# Patient Record
Sex: Female | Born: 1940
Health system: Southern US, Community
[De-identification: ages and names within clinical notes are randomized; demographics above are authoritative.]

## PROBLEM LIST (undated history)

## (undated) ENCOUNTER — Ambulatory Visit: Source: Home / Self Care

## (undated) DIAGNOSIS — I10 Essential (primary) hypertension: Secondary | ICD-10-CM

## (undated) DIAGNOSIS — Z803 Family history of malignant neoplasm of breast: Secondary | ICD-10-CM

## (undated) DIAGNOSIS — I639 Cerebral infarction, unspecified: Secondary | ICD-10-CM

## (undated) DIAGNOSIS — H269 Unspecified cataract: Secondary | ICD-10-CM

## (undated) DIAGNOSIS — Z800 Family history of malignant neoplasm of digestive organs: Secondary | ICD-10-CM

## (undated) DIAGNOSIS — Z1379 Encounter for other screening for genetic and chromosomal anomalies: Secondary | ICD-10-CM

## (undated) DIAGNOSIS — E119 Type 2 diabetes mellitus without complications: Secondary | ICD-10-CM

## (undated) DIAGNOSIS — I4891 Unspecified atrial fibrillation: Secondary | ICD-10-CM

## (undated) HISTORY — PX: CHOLECYSTECTOMY: SHX55

## (undated) HISTORY — DX: Family history of malignant neoplasm of digestive organs: Z80.0

## (undated) HISTORY — DX: Family history of malignant neoplasm of breast: Z80.3

## (undated) HISTORY — DX: Essential (primary) hypertension: I10

## (undated) HISTORY — DX: Cerebral infarction, unspecified: I63.9

## (undated) HISTORY — PX: ABDOMINAL HYSTERECTOMY: SHX81

## (undated) HISTORY — DX: Unspecified cataract: H26.9

## (undated) HISTORY — DX: Encounter for other screening for genetic and chromosomal anomalies: Z13.79

## (undated) HISTORY — DX: Unspecified atrial fibrillation: I48.91

---

## 1998-08-28 ENCOUNTER — Other Ambulatory Visit: Admission: RE | Admit: 1998-08-28 | Discharge: 1998-08-28 | Payer: Self-pay | Admitting: Gynecology

## 1998-10-24 ENCOUNTER — Encounter: Payer: Self-pay | Admitting: Gynecology

## 1998-10-29 ENCOUNTER — Inpatient Hospital Stay (HOSPITAL_COMMUNITY): Admission: RE | Admit: 1998-10-29 | Discharge: 1998-10-31 | Payer: Self-pay | Admitting: Gynecology

## 1999-07-10 ENCOUNTER — Encounter: Payer: Self-pay | Admitting: Gynecology

## 1999-07-10 ENCOUNTER — Ambulatory Visit (HOSPITAL_COMMUNITY): Admission: RE | Admit: 1999-07-10 | Discharge: 1999-07-10 | Payer: Self-pay | Admitting: Gynecology

## 2000-07-19 ENCOUNTER — Other Ambulatory Visit: Admission: RE | Admit: 2000-07-19 | Discharge: 2000-07-19 | Payer: Self-pay | Admitting: Gynecology

## 2000-08-23 ENCOUNTER — Encounter: Admission: RE | Admit: 2000-08-23 | Discharge: 2000-08-23 | Payer: Self-pay | Admitting: Gynecology

## 2000-08-23 ENCOUNTER — Encounter: Payer: Self-pay | Admitting: Gynecology

## 2001-09-07 ENCOUNTER — Other Ambulatory Visit: Admission: RE | Admit: 2001-09-07 | Discharge: 2001-09-07 | Payer: Self-pay | Admitting: Gynecology

## 2002-11-01 ENCOUNTER — Other Ambulatory Visit: Admission: RE | Admit: 2002-11-01 | Discharge: 2002-11-01 | Payer: Self-pay | Admitting: Gynecology

## 2002-11-14 ENCOUNTER — Encounter: Admission: RE | Admit: 2002-11-14 | Discharge: 2002-11-14 | Payer: Self-pay | Admitting: Gynecology

## 2002-11-14 ENCOUNTER — Encounter: Payer: Self-pay | Admitting: Gynecology

## 2006-11-14 ENCOUNTER — Other Ambulatory Visit: Admission: RE | Admit: 2006-11-14 | Discharge: 2006-11-14 | Payer: Self-pay | Admitting: Gynecology

## 2008-04-17 ENCOUNTER — Encounter: Admission: RE | Admit: 2008-04-17 | Discharge: 2008-04-17 | Payer: Self-pay | Admitting: Internal Medicine

## 2010-03-12 ENCOUNTER — Encounter: Admission: RE | Admit: 2010-03-12 | Discharge: 2010-03-12 | Payer: Self-pay | Admitting: Internal Medicine

## 2010-09-28 ENCOUNTER — Other Ambulatory Visit (HOSPITAL_COMMUNITY): Payer: Self-pay | Admitting: Ophthalmology

## 2010-09-28 ENCOUNTER — Encounter (HOSPITAL_COMMUNITY)
Admission: RE | Admit: 2010-09-28 | Discharge: 2010-09-28 | Disposition: A | Discharge status: Home / Self Care | Payer: Medicare Other | Source: Ambulatory Visit | Attending: Ophthalmology | Admitting: Ophthalmology

## 2010-09-28 ENCOUNTER — Ambulatory Visit (HOSPITAL_COMMUNITY)
Admission: RE | Admit: 2010-09-28 | Discharge: 2010-09-28 | Disposition: A | Discharge status: Home / Self Care | Payer: Medicare Other | Source: Ambulatory Visit | Attending: Ophthalmology | Admitting: Ophthalmology

## 2010-09-28 DIAGNOSIS — I10 Essential (primary) hypertension: Secondary | ICD-10-CM | POA: Insufficient documentation

## 2010-09-28 DIAGNOSIS — R059 Cough, unspecified: Secondary | ICD-10-CM | POA: Insufficient documentation

## 2010-09-28 DIAGNOSIS — F17200 Nicotine dependence, unspecified, uncomplicated: Secondary | ICD-10-CM | POA: Insufficient documentation

## 2010-09-28 DIAGNOSIS — Z01818 Encounter for other preprocedural examination: Secondary | ICD-10-CM | POA: Insufficient documentation

## 2010-09-28 DIAGNOSIS — R05 Cough: Secondary | ICD-10-CM | POA: Insufficient documentation

## 2010-09-28 DIAGNOSIS — Z01812 Encounter for preprocedural laboratory examination: Secondary | ICD-10-CM | POA: Insufficient documentation

## 2010-09-28 LAB — DIFFERENTIAL
Basophils Absolute: 0.1 10*3/uL (ref 0.0–0.1)
Lymphocytes Relative: 25 % (ref 12–46)
Lymphs Abs: 2.2 10*3/uL (ref 0.7–4.0)
Monocytes Absolute: 0.5 10*3/uL (ref 0.1–1.0)
Neutro Abs: 5.8 10*3/uL (ref 1.7–7.7)

## 2010-09-28 LAB — BASIC METABOLIC PANEL
CO2: 28 mEq/L (ref 19–32)
Calcium: 8.1 mg/dL — ABNORMAL LOW (ref 8.4–10.5)
Glucose, Bld: 120 mg/dL — ABNORMAL HIGH (ref 70–99)
Potassium: 4.5 mEq/L (ref 3.5–5.1)
Sodium: 139 mEq/L (ref 135–145)

## 2010-09-28 LAB — CBC
HCT: 41.3 % (ref 36.0–46.0)
Hemoglobin: 13.7 g/dL (ref 12.0–15.0)
MCHC: 33.2 g/dL (ref 30.0–36.0)

## 2010-09-30 ENCOUNTER — Ambulatory Visit (HOSPITAL_COMMUNITY)
Admission: RE | Admit: 2010-09-30 | Discharge: 2010-09-30 | Disposition: A | Discharge status: Home / Self Care | Payer: Medicare Other | Source: Ambulatory Visit | Attending: Ophthalmology | Admitting: Ophthalmology

## 2010-09-30 DIAGNOSIS — H269 Unspecified cataract: Secondary | ICD-10-CM | POA: Insufficient documentation

## 2010-09-30 DIAGNOSIS — E119 Type 2 diabetes mellitus without complications: Secondary | ICD-10-CM | POA: Insufficient documentation

## 2010-09-30 LAB — GLUCOSE, CAPILLARY: Glucose-Capillary: 125 mg/dL — ABNORMAL HIGH (ref 70–99)

## 2010-10-14 NOTE — Op Note (Signed)
  NAME:  Diana Cervantes, Diana Cervantes            ACCOUNT NO.:  [id number]  MEDICAL RECORD NO.:  [id number]           PATIENT TYPE:  O  LOCATION:  SDSC                         FACILITY:  [facility]  PHYSICIAN:  Shade Flood, MD       DATE OF BIRTH:  02/20/1941  DATE OF PROCEDURE:  09/30/2010 DATE OF DISCHARGE:  09/30/2010                              OPERATIVE REPORT   PREOPERATIVE DIAGNOSIS:  Cataract, left eye.  POSTOPERATIVE DIAGNOSIS:  Cataract, left eye.  PROCEDURE PERFORMED:  Phacoemulsification with posterior chamber intraocular lens, left eye.  There were no complications.  No specimens for pathology.  Estimated blood loss was less 1 mL.  The patient was prepared and draped in the usual fashion for ocular surgery on the left eye and a solid lid speculum was placed.  Calipers were used to show 3-1/2 mm at the peripheral surgical limbus centered at the 11 o'clock meridian.  A Beaver 01 blade was used to make a peripheral clear corneal groove and then the keratome was used to make a shelving self-sealing wound into the anterior chamber.  A separate stab incision was made at the 2:30 meridian of the clear cornea using a 15- degree blade.  Provisc was instilled into the anterior chamber and then a bent 26-gauge needle was used to perform a capsulorrhexis.  The patient's lens was then hydrodissected using balanced salt solution and then the Chang chopper was inserted to rotate the lens within the capsule bag to ensure adequate mobility.    The wound was enlarged to permit the placement of phaco handpiece and the Chang chopper was inserted.  A combined phaco/chop technique was employed fracturing the lens into four sections.  Each was removed with the phaco handpiece uneventfully.  The IA cannula was then used to remove cortex from the capsule bag.  Once the cortex was removed from the capsule bag, Provisc was again instilled into the anterior chamber and into the posterior capsule.  The  wound was enlarged to its full extent 3.5 mm and the Monarch injector was used to place a folded AcrySof MA50BM intraocular lens into the capsular bag.  The trailing haptic was dialed in using a Sinskey lens hook.  The IA cannula was then used to remove viscoelastic from the anterior chamber and the wound was closed with one interrupted 10-0 nylon suture.  The patient was given 100 mg of Ancef subconjunctivally with the tip of the needle visualized and then the patient was given 4 mg of Decadron subconjunctivally also with the tip of the needle visualized.  The lid speculum was removed and the patient's eye was patched using polymyxin/bacitracin ointment and a plastic shield.  She was transferred alert and conversant from the operating room to the postoperative recovery area.          ______________________________ Shade Flood, MD     GG/MEDQ  D:  09/30/2010  T:  10/01/2010  Job:  [id number]  Electronically Signed by Shade Flood MD on 10/14/2010 08:52:09 AM

## 2011-04-09 ENCOUNTER — Other Ambulatory Visit: Payer: Self-pay | Admitting: Internal Medicine

## 2011-04-09 DIAGNOSIS — Z1231 Encounter for screening mammogram for malignant neoplasm of breast: Secondary | ICD-10-CM

## 2011-04-12 ENCOUNTER — Ambulatory Visit
Admission: RE | Admit: 2011-04-12 | Discharge: 2011-04-12 | Disposition: A | Discharge status: Home / Self Care | Payer: Medicare Other | Source: Ambulatory Visit | Attending: Internal Medicine | Admitting: Internal Medicine

## 2011-04-12 DIAGNOSIS — Z1231 Encounter for screening mammogram for malignant neoplasm of breast: Secondary | ICD-10-CM

## 2011-11-22 ENCOUNTER — Other Ambulatory Visit: Payer: Self-pay | Admitting: Internal Medicine

## 2011-11-22 DIAGNOSIS — M810 Age-related osteoporosis without current pathological fracture: Secondary | ICD-10-CM

## 2011-12-17 ENCOUNTER — Ambulatory Visit
Admission: RE | Admit: 2011-12-17 | Discharge: 2011-12-17 | Disposition: A | Discharge status: Home / Self Care | Payer: Medicare Other | Source: Ambulatory Visit | Attending: Internal Medicine | Admitting: Internal Medicine

## 2011-12-17 DIAGNOSIS — M810 Age-related osteoporosis without current pathological fracture: Secondary | ICD-10-CM

## 2012-03-21 ENCOUNTER — Other Ambulatory Visit: Payer: Self-pay | Admitting: Internal Medicine

## 2012-03-21 DIAGNOSIS — Z1231 Encounter for screening mammogram for malignant neoplasm of breast: Secondary | ICD-10-CM

## 2012-04-21 ENCOUNTER — Ambulatory Visit
Admission: RE | Admit: 2012-04-21 | Discharge: 2012-04-21 | Disposition: A | Discharge status: Home / Self Care | Payer: Medicare Other | Source: Ambulatory Visit | Attending: Internal Medicine | Admitting: Internal Medicine

## 2012-04-21 DIAGNOSIS — Z1231 Encounter for screening mammogram for malignant neoplasm of breast: Secondary | ICD-10-CM

## 2012-04-25 ENCOUNTER — Other Ambulatory Visit: Payer: Self-pay | Admitting: Internal Medicine

## 2012-04-25 DIAGNOSIS — R928 Other abnormal and inconclusive findings on diagnostic imaging of breast: Secondary | ICD-10-CM

## 2012-05-09 ENCOUNTER — Ambulatory Visit
Admission: RE | Admit: 2012-05-09 | Discharge: 2012-05-09 | Disposition: A | Discharge status: Home / Self Care | Payer: Medicare Other | Source: Ambulatory Visit | Attending: Internal Medicine | Admitting: Internal Medicine

## 2012-05-09 DIAGNOSIS — R928 Other abnormal and inconclusive findings on diagnostic imaging of breast: Secondary | ICD-10-CM

## 2013-09-20 ENCOUNTER — Other Ambulatory Visit: Payer: Self-pay

## 2013-09-20 DIAGNOSIS — Z1231 Encounter for screening mammogram for malignant neoplasm of breast: Secondary | ICD-10-CM

## 2013-10-04 ENCOUNTER — Encounter (INDEPENDENT_AMBULATORY_CARE_PROVIDER_SITE_OTHER): Payer: Self-pay

## 2013-10-04 ENCOUNTER — Ambulatory Visit
Admission: RE | Admit: 2013-10-04 | Discharge: 2013-10-04 | Disposition: A | Discharge status: Home / Self Care | Payer: Medicare Other | Source: Ambulatory Visit

## 2013-10-04 DIAGNOSIS — Z1231 Encounter for screening mammogram for malignant neoplasm of breast: Secondary | ICD-10-CM

## 2014-04-12 ENCOUNTER — Other Ambulatory Visit: Payer: Self-pay | Admitting: Internal Medicine

## 2014-04-12 DIAGNOSIS — M8580 Other specified disorders of bone density and structure, unspecified site: Secondary | ICD-10-CM

## 2014-04-12 DIAGNOSIS — M810 Age-related osteoporosis without current pathological fracture: Secondary | ICD-10-CM

## 2014-06-10 ENCOUNTER — Ambulatory Visit
Admission: RE | Admit: 2014-06-10 | Discharge: 2014-06-10 | Disposition: A | Discharge status: Home / Self Care | Payer: PPO | Source: Ambulatory Visit | Attending: Internal Medicine | Admitting: Internal Medicine

## 2014-06-10 DIAGNOSIS — M8580 Other specified disorders of bone density and structure, unspecified site: Secondary | ICD-10-CM

## 2014-06-10 DIAGNOSIS — M810 Age-related osteoporosis without current pathological fracture: Secondary | ICD-10-CM

## 2015-07-19 ENCOUNTER — Ambulatory Visit (INDEPENDENT_AMBULATORY_CARE_PROVIDER_SITE_OTHER): Payer: PPO | Admitting: Family Medicine

## 2015-07-19 VITALS — BP 126/64 | HR 110 | Temp 99.2°F | Resp 14 | Ht 62.0 in | Wt 156.0 lb

## 2015-07-19 DIAGNOSIS — J209 Acute bronchitis, unspecified: Secondary | ICD-10-CM

## 2015-07-19 MED ORDER — LEVOFLOXACIN 500 MG PO TABS: 500.0000 mg | ORAL_TABLET | Freq: Every day | ORAL | Status: DC

## 2015-07-19 MED ORDER — HYDROCOD POLST-CPM POLST ER 10-8 MG/5ML PO SUER: 5.0000 mL | Freq: Two times a day (BID) | ORAL | Status: DC | PRN

## 2015-07-19 NOTE — Progress Notes (Signed)
@UMFCLOGO @  By signing my name below, I, [name], attest that this documentation has been prepared under the direction and in the presence of Robyn Haber, MD.  Electronically Signed: Thea Alken, ED Scribe. 07/19/2015. 8:52 AM.  Patient ID: Diana Cervantes MRN: [id number], DOB: February 20, 1941, 75 y.o. Date of Encounter: 07/19/2015, 8:50 AM  Primary Physician: No primary care provider on file.  Chief Complaint:  Chief Complaint  Patient presents with  . Cough    x 3 days, non productive  . Diarrhea    the first day of the cough    HPI: 75 y.o. year old female with history below presents with dry  cough that began 5 days ago. Pt states she initially had diarrhea with cough 5 days ago but this has subsided. Cough keeps her awake at night. She is unsure of fever but has a low garde fever of 99.2 in office. She reports having bronchitis in the past and that current symptoms are similar. She denies sinus pressure and congestion. She denies hx of asthma. She is a former smoker. Pt denies drug allergies.   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    Past Medical History  Diagnosis Date  . Hypertension   . Cataract      Home Meds: Prior to Admission medications   Medication Sig Start Date End Date Taking? Authorizing Provider  amLODipine (NORVASC) 5 MG tablet Take 5 mg by mouth daily.   Yes Historical Provider, MD  B Complex-C (SUPER B COMPLEX PO) Take by mouth.   Yes Historical Provider, MD  CALCIUM PO Take by mouth.   Yes Historical Provider, MD  Cholecalciferol (VITAMIN D-3) 1000 units CAPS Take by mouth.   Yes Historical Provider, MD  linagliptin (TRADJENTA) 5 MG TABS tablet Take 5 mg by mouth daily.   Yes Historical Provider, MD  Probiotic Product (PROBIOTIC DAILY PO) Take by mouth.   Yes Historical Provider, MD  Psyllium (METAMUCIL) 28.3 % POWD Take by mouth.   Yes Historical Provider, MD  rosuvastatin (CRESTOR) 10 MG tablet Take 10 mg by mouth daily.   Yes  Historical Provider, MD  valsartan (DIOVAN) 40 MG tablet Take 40 mg by mouth daily.   Yes Historical Provider, MD    Allergies:  Allergies  Allergen Reactions  . Latex     Social History   Social History  . Marital Status: Divorced    Spouse Name: N/A  . Number of Children: N/A  . Years of Education: N/A   Occupational History  . Not on file.   Social History Main Topics  . Smoking status: Former Research scientist (life sciences)  . Smokeless tobacco: Not on file     Comment: Quit 2 years ago - uses E-Ciggs  . Alcohol Use: No  . Drug Use: No  . Sexual Activity: Not on file   Other Topics Concern  . Not on file   Social History Narrative  . No narrative on file     Review of Systems: Constitutional: negative for chills, night sweats, weight changes, or fatigue  HEENT: negative for vision changes, hearing loss, congestion, rhinorrhea, ST, epistaxis, or sinus pressure Cardiovascular: negative for chest pain or palpitations Respiratory: negative for hemoptysis, wheezing, shortness of breath. Abdominal: negative for abdominal pain, nausea, vomiting, or constipation Dermatological: negative for rash Neurologic: negative for headache, dizziness, or syncope All other systems reviewed and are otherwise negative with the exception to those above and in the HPI.   Physical  Exam: Blood pressure 126/64, pulse 110, temperature 99.2 F (37.3 C), temperature source Oral, resp. rate 14, height 5\' 2"  (1.575 m), weight 156 lb (70.761 kg), SpO2 97 %., Body mass index is 28.53 kg/(m^2). General: Well developed, well nourished, in no acute distress. Head: Normocephalic, atraumatic, eyes without discharge, sclera non-icteric, nares are without discharge. Bilateral auditory canals clear, TM's are without perforation, pearly grey and translucent with reflective cone of light bilaterally. Oral cavity moist, posterior pharynx without exudate, erythema, peritonsillar abscess, or post nasal drip.  Neck: Supple. No  thyromegaly. Full ROM. No lymphadenopathy. Lungs:  Expiratory wheeze. Few faint rales in left chest.  Breathing is unlabored.  Heart: RRR with S1 S2. No murmurs, rubs, or gallops appreciated. Msk:  Strength and tone normal for age. Extremities/Skin: Warm and dry. No clubbing or cyanosis. No edema. No rashes or suspicious lesions. Neuro: Alert and oriented X 3. Moves all extremities spontaneously. Gait is normal. CNII-XII grossly in tact. Psych:  Responds to questions appropriately with a normal affect.    ASSESSMENT AND PLAN:  75 y.o. year old female with  This chart was scribed in my presence and reviewed by me personally.    ICD-9-CM ICD-10-CM   1. Acute bronchitis, unspecified organism 466.0 J20.9 levofloxacin (LEVAQUIN) 500 MG tablet     chlorpheniramine-HYDROcodone (TUSSIONEX PENNKINETIC ER) 10-8 MG/5ML SUER     Signed, Robyn Haber, MD 07/19/2015 8:50 AM

## 2015-07-19 NOTE — Patient Instructions (Signed)

## 2015-10-22 ENCOUNTER — Emergency Department (HOSPITAL_COMMUNITY): Payer: PPO

## 2015-10-22 ENCOUNTER — Ambulatory Visit (INDEPENDENT_AMBULATORY_CARE_PROVIDER_SITE_OTHER): Payer: PPO | Admitting: Physician Assistant

## 2015-10-22 ENCOUNTER — Emergency Department (HOSPITAL_COMMUNITY)
Admission: EM | Admit: 2015-10-22 | Discharge: 2015-10-22 | Disposition: A | Discharge status: Home / Self Care | Payer: PPO | Attending: Emergency Medicine | Admitting: Emergency Medicine

## 2015-10-22 ENCOUNTER — Encounter (HOSPITAL_COMMUNITY): Payer: Self-pay | Admitting: Emergency Medicine

## 2015-10-22 VITALS — BP 134/88 | HR 120 | Temp 98.2°F | Resp 17 | Ht 62.0 in | Wt 163.0 lb

## 2015-10-22 DIAGNOSIS — I10 Essential (primary) hypertension: Secondary | ICD-10-CM | POA: Diagnosis not present

## 2015-10-22 DIAGNOSIS — H269 Unspecified cataract: Secondary | ICD-10-CM | POA: Diagnosis not present

## 2015-10-22 DIAGNOSIS — R05 Cough: Secondary | ICD-10-CM | POA: Diagnosis not present

## 2015-10-22 DIAGNOSIS — I4891 Unspecified atrial fibrillation: Secondary | ICD-10-CM

## 2015-10-22 DIAGNOSIS — R0609 Other forms of dyspnea: Secondary | ICD-10-CM | POA: Diagnosis not present

## 2015-10-22 DIAGNOSIS — Z792 Long term (current) use of antibiotics: Secondary | ICD-10-CM | POA: Diagnosis not present

## 2015-10-22 DIAGNOSIS — Z91040 Latex allergy status: Secondary | ICD-10-CM | POA: Insufficient documentation

## 2015-10-22 DIAGNOSIS — J069 Acute upper respiratory infection, unspecified: Secondary | ICD-10-CM

## 2015-10-22 DIAGNOSIS — R110 Nausea: Secondary | ICD-10-CM | POA: Diagnosis not present

## 2015-10-22 DIAGNOSIS — R0602 Shortness of breath: Secondary | ICD-10-CM | POA: Diagnosis not present

## 2015-10-22 DIAGNOSIS — E119 Type 2 diabetes mellitus without complications: Secondary | ICD-10-CM | POA: Insufficient documentation

## 2015-10-22 DIAGNOSIS — Z79899 Other long term (current) drug therapy: Secondary | ICD-10-CM | POA: Diagnosis not present

## 2015-10-22 DIAGNOSIS — Z87891 Personal history of nicotine dependence: Secondary | ICD-10-CM | POA: Insufficient documentation

## 2015-10-22 DIAGNOSIS — R059 Cough, unspecified: Secondary | ICD-10-CM

## 2015-10-22 HISTORY — DX: Type 2 diabetes mellitus without complications: E11.9

## 2015-10-22 LAB — I-STAT TROPONIN, ED
Troponin i, poc: 0.01 ng/mL (ref 0.00–0.08)
Troponin i, poc: 0.01 ng/mL (ref 0.00–0.08)

## 2015-10-22 LAB — BASIC METABOLIC PANEL
Anion gap: 10 (ref 5–15)
BUN: 6 mg/dL (ref 6–20)
CALCIUM: 7.8 mg/dL — AB (ref 8.9–10.3)
CO2: 27 mmol/L (ref 22–32)
CREATININE: 0.72 mg/dL (ref 0.44–1.00)
Chloride: 102 mmol/L (ref 101–111)
GFR calc non Af Amer: >60 mL/min (ref >60)
Glucose, Bld: 144 mg/dL — ABNORMAL HIGH (ref 65–99)
Potassium: 3.3 mmol/L — ABNORMAL LOW (ref 3.5–5.1)
Sodium: 139 mmol/L (ref 135–145)

## 2015-10-22 LAB — CBC
HCT: 37.3 % (ref 36.0–46.0)
Hemoglobin: 11.8 g/dL — ABNORMAL LOW (ref 12.0–15.0)
MCH: 25.5 pg — AB (ref 26.0–34.0)
MCHC: 31.6 g/dL (ref 30.0–36.0)
MCV: 80.7 fL (ref 78.0–100.0)
PLATELETS: 234 10*3/uL (ref 150–400)
RBC: 4.62 MIL/uL (ref 3.87–5.11)
RDW: 14.4 % (ref 11.5–15.5)
WBC: 6.6 10*3/uL (ref 4.0–10.5)

## 2015-10-22 LAB — BRAIN NATRIURETIC PEPTIDE: B Natriuretic Peptide: 271.8 pg/mL — ABNORMAL HIGH (ref 0.0–100.0)

## 2015-10-22 MED ORDER — RIVAROXABAN 10 MG PO TABS
20.0000 mg | ORAL_TABLET | Freq: Once | ORAL | Status: AC
  Administered 2015-10-22: 20 mg via ORAL
  Filled 2015-10-22: qty 2

## 2015-10-22 MED ORDER — RIVAROXABAN 20 MG PO TABS: 20.0000 mg | ORAL_TABLET | Freq: Every day | ORAL | Status: DC

## 2015-10-22 MED ORDER — ALBUTEROL SULFATE (2.5 MG/3ML) 0.083% IN NEBU: 5.0000 mg | INHALATION_SOLUTION | Freq: Once | RESPIRATORY_TRACT | Status: DC

## 2015-10-22 MED ORDER — BENZONATATE 100 MG PO CAPS: 100.0000 mg | ORAL_CAPSULE | Freq: Three times a day (TID) | ORAL | Status: DC

## 2015-10-22 NOTE — ED Notes (Signed)
EDP at bedside  

## 2015-10-22 NOTE — ED Notes (Signed)
Pt arrives from [facility] via [facility] c/o SOB x 1 week with nonproductive cough.  EMS reports wheezing in L , gave 5mg  albuterol.  EMS reports depression and irreg HR on EKG. NAD noted at this time. Resp e/u.

## 2015-10-22 NOTE — ED Notes (Signed)
Pt and family wants update about what is taking so long. Informed pt that she will be DC we are just waiting on a cardiology consult (which EDP did inform them about) pt family members states "well they better make it snappy"

## 2015-10-22 NOTE — Progress Notes (Signed)
[facility] [address] [place] [address] [phone]  Date:  10/22/2015   Name:  Diana Cervantes   DOB:  10/31/1940   MRN:  [id number]  PCP:  No primary care provider on file.    History of Present Illness:  Diana Cervantes is a 75 y.o. female patient who presents to [facility] for cc of sob, nausea, and cough.   Patient reports 1 week of a non-productive cough.  She has had no fever or dyspnea.  She has no otalgia or nasal congestion.  Throat is painful secondary to coughing.  She has taken dayquil for her cough.  She denies dizziness, chest pains or sob.   Patient noticed sob that started today.  This was more prominent during the day.  She noticed it worsening as she was walking to her room, where she was having sob.  She takes baby aspirin daily.  She denies diaphoresis or leg swelling.  HTN followed by Dr. Nadyne Coombes.  She has no hx of irregular heart beat that she recalls.     There are no active problems to display for this patient.   Past Medical History  Diagnosis Date  . Hypertension   . Cataract     Past Surgical History  Procedure Laterality Date  . Cholecystectomy    . Abdominal hysterectomy      Social History  Substance Use Topics  . Smoking status: Former Research scientist (life sciences)  . Smokeless tobacco: None     Comment: Quit 2 years ago - uses E-Ciggs  . Alcohol Use: No    No family history on file.  Allergies  Allergen Reactions  . Latex     Medication list has been reviewed and updated.  Current Outpatient Prescriptions on File Prior to Visit  Medication Sig Dispense Refill  . amLODipine (NORVASC) 5 MG tablet Take 5 mg by mouth daily.    . B Complex-C (SUPER B COMPLEX PO) Take by mouth.    Marland Kitchen CALCIUM PO Take by mouth.    . chlorpheniramine-HYDROcodone (TUSSIONEX PENNKINETIC ER) 10-8 MG/5ML SUER Take 5 mLs by mouth every 12 (twelve) hours as needed. 100 mL 0  . Cholecalciferol (VITAMIN D-3) 1000 units CAPS Take by mouth.    . levofloxacin  (LEVAQUIN) 500 MG tablet Take 1 tablet (500 mg total) by mouth daily. 7 tablet 0  . linagliptin (TRADJENTA) 5 MG TABS tablet Take 5 mg by mouth daily.    . Probiotic Product (PROBIOTIC DAILY PO) Take by mouth.    . Psyllium (METAMUCIL) 28.3 % POWD Take by mouth.    . rosuvastatin (CRESTOR) 10 MG tablet Take 10 mg by mouth daily.    . valsartan (DIOVAN) 40 MG tablet Take 40 mg by mouth daily.     No current facility-administered medications on file prior to visit.    ROS ROS otherwise unremarkable unless listed above  Physical Examination: BP 134/88 mmHg  Pulse 120  Temp(Src) 98.2 F (36.8 C) (Oral)  Resp 17  Ht 5\' 2"  (1.575 m)  Wt 163 lb (73.936 kg)  BMI 29.81 kg/m2  SpO2 95% Ideal Body Weight: Weight in (lb) to have BMI = 25: 136.4  Physical Exam  Constitutional: She is oriented to person, place, and time. She appears well-developed and well-nourished. No distress.  HENT:  Head: Normocephalic and atraumatic.  Right Ear: Tympanic membrane, external ear and ear canal normal.  Left Ear: Tympanic membrane, external ear and ear canal normal.  Nose: Mucosal  edema and rhinorrhea present. Right sinus exhibits no maxillary sinus tenderness and no frontal sinus tenderness. Left sinus exhibits no maxillary sinus tenderness and no frontal sinus tenderness.  Mouth/Throat: No uvula swelling. No oropharyngeal exudate, posterior oropharyngeal edema or posterior oropharyngeal erythema.  Eyes: Conjunctivae and EOM are normal. Pupils are equal, round, and reactive to light.  Cardiovascular: An irregularly irregular rhythm present. Tachycardia present.  Exam reveals no gallop, no distant heart sounds and no friction rub.   No murmur heard. Pulses:      Radial pulses are 2+ on the left side.       Dorsalis pedis pulses are 2+ on the right side, and 2+ on the left side.  Pulmonary/Chest: Effort normal. No respiratory distress. She has no decreased breath sounds. She has no wheezes. She has no  rhonchi.  Lymphadenopathy:       Head (right side): No submandibular, no tonsillar, no preauricular and no posterior auricular adenopathy present.       Head (left side): No submandibular, no tonsillar, no preauricular and no posterior auricular adenopathy present.  Neurological: She is alert and oriented to person, place, and time.  Skin: She is not diaphoretic.  Psychiatric: She has a normal mood and affect. Her behavior is normal.   Assessment and Plan: [name] is a 75 y.o. female who is here today for cc of sob, dyspnea, or nausea. -ekg was alarming for new atrial fibrillation, and possible ischemia.  This could be secondary to dayquil, however this could possibly be heart failure.   --I am transferring [facility] to [facility] for emergent [facility].  There is possible bronchitis, but fibrillation is concerning.    Cough - Plan: EKG 12-Lead  Atrial fibrillation, unspecified type (HCC)  Dyspnea on exertion - Plan: EKG 12-Lead  Nausea without vomiting - Plan: EKG 12-Lead   Ivar Drape, PA-C [facility] 10/22/2015 6:31 PM

## 2015-10-22 NOTE — Patient Instructions (Signed)
     IF you received an x-ray today, you will receive an invoice from [facility]. Please contact [facility] at [phone] with questions or concerns regarding your invoice.   IF you received labwork today, you will receive an invoice from [facility]. Please contact [facility] at [phone] with questions or concerns regarding your invoice.   Our billing staff will not be able to assist you with questions regarding bills from these companies.  You will be contacted with the lab results as soon as they are available. The fastest way to get your results is to activate your My Chart account. Instructions are located on the last page of this paperwork. If you have not heard from us regarding the results in 2 weeks, please contact this office.      

## 2015-10-22 NOTE — Discharge Instructions (Signed)
Atrial Fibrillation Atrial fibrillation is a type of heartbeat that is irregular or fast (rapid). If you have this condition, your heart keeps quivering in a weird (chaotic) way. This condition can make it so your heart cannot pump blood normally. Having this condition gives a person more risk for stroke, heart failure, and other heart problems. There are different types of atrial fibrillation. Talk with your doctor to learn about the type that you have. HOME CARE  Take over-the-counter and prescription medicines only as told by your doctor.  If your doctor prescribed a blood-thinning medicine, take it exactly as told. Taking too much of it can cause bleeding. If you do not take enough of it, you will not have the protection that you need against stroke and other problems.  Do not use any tobacco products. These include cigarettes, chewing tobacco, and e-cigarettes. If you need help quitting, ask your doctor.  If you have apnea (obstructive sleep apnea), manage it as told by your doctor.  Do not drink alcohol.  Do not drink beverages that have caffeine. These include coffee, soda, and tea.  Maintain a healthy weight. Do not use diet pills unless your doctor says they are safe for you. Diet pills may make heart problems worse.  Follow diet instructions as told by your doctor.  Exercise regularly as told by your doctor.  Keep all follow-up visits as told by your doctor. This is important. GET HELP IF:  You notice a change in the speed, rhythm, or strength of your heartbeat.  You are taking a blood-thinning medicine and you notice more bruising.  You get tired more easily when you move or exercise. GET HELP RIGHT AWAY IF:  You have pain in your chest or your belly (abdomen).  You have sweating or weakness.  You feel sick to your stomach (nauseous).  You notice blood in your throw up (vomit), poop (stool), or pee (urine).  You are short of breath.  You suddenly have swollen feet  and ankles.  You feel dizzy.  Your suddenly get weak or numb in your face, arms, or legs, especially if it happens on one side of your body.  You have trouble talking, trouble understanding, or both.  Your face or your eyelid droops on one side. These symptoms may be an emergency. Do not wait to see if the symptoms will go away. Get medical help right away. Call your local emergency services (911 in the [place] Do not drive yourself to the hospital.   This information is not intended to replace advice given to you by your health care provider. Make sure you discuss any questions you have with your health care provider.   Document Released: 02/24/2008 Document Revised: 02/05/2015 Document Reviewed: 09/11/2014 Elsevier Interactive Patient Education 2016 [place] on my medicine - XARELTO (Rivaroxaban)  This medication education was reviewed with me or my healthcare representative as part of my discharge preparation.  The pharmacist that spoke with me during my hospital stay was:  Rebecka Apley, [facility]  Why was Xarelto prescribed for you? Xarelto was prescribed for you to reduce the risk of a blood clot forming that can cause a stroke if you have a medical condition called atrial fibrillation (a type of irregular heartbeat).  What do you need to know about xarelto ? Take your Xarelto ONCE DAILY at the same time every day with your evening meal. If you have difficulty swallowing the tablet whole, you may crush it and mix in  applesauce just prior to taking your dose.  Take Xarelto exactly as prescribed by your doctor and DO NOT stop taking Xarelto without talking to the doctor who prescribed the medication.  Stopping without other stroke prevention medication to take the place of Xarelto may increase your risk of developing a clot that causes a stroke.  Refill your prescription before you run out.  After discharge, you should have regular check-up appointments with your  healthcare provider that is prescribing your Xarelto.  In the future your dose may need to be changed if your kidney function or weight changes by a significant amount.  What do you do if you miss a dose? If you are taking Xarelto ONCE DAILY and you miss a dose, take it as soon as you remember on the same day then continue your regularly scheduled once daily regimen the next day. Do not take two doses of Xarelto at the same time or on the same day.   Important Safety Information A possible side effect of Xarelto is bleeding. You should call your healthcare provider right away if you experience any of the following: ? Bleeding from an injury or your nose that does not stop. ? Unusual colored urine (red or dark brown) or unusual colored stools (red or black). ? Unusual bruising for unknown reasons. ? A serious fall or if you hit your head (even if there is no bleeding).  Some medicines may interact with Xarelto and might increase your risk of bleeding while on Xarelto. To help avoid this, consult your healthcare provider or pharmacist prior to using any new prescription or non-prescription medications, including herbals, vitamins, non-steroidal anti-inflammatory drugs (NSAIDs) and supplements.  This website has more information on Xarelto: [email or web address]-benson.[email or web address]

## 2015-10-22 NOTE — ED Provider Notes (Signed)
Pt presents to the ED with new onset a fib.  Sent from urgent care after going there for a non productive cough.   Physical Exam  BP 139/73 mmHg  Pulse 88  Temp(Src) 97.7 F (36.5 C) (Oral)  Resp 23  Ht 5\' 2"  (1.575 m)  Wt 72.576 kg  BMI 29.26 kg/m2  SpO2 94%  Physical Exam  Constitutional: She appears well-developed and well-nourished. No distress.  HENT:  Head: Normocephalic and atraumatic.  Right Ear: External ear normal.  Left Ear: External ear normal.  Eyes: Conjunctivae are normal. Right eye exhibits no discharge. Left eye exhibits no discharge. No scleral icterus.  Neck: Neck supple. No tracheal deviation present.  Cardiovascular: Normal rate.  An irregularly irregular rhythm present.  Pulmonary/Chest: Effort normal. No stridor. No respiratory distress.  Musculoskeletal: She exhibits no edema.  Neurological: She is alert. Cranial nerve deficit: no gross deficits.  Skin: Skin is warm and dry. No rash noted.  Psychiatric: She has a normal mood and affect.  Nursing note and vitals reviewed.   ED Course  Procedures  EKG Interpretation  Date/Time:  Wednesday Oct 22 2015 18:27:59 EDT Ventricular Rate:  104 PR Interval:    QRS Duration: 82 QT Interval:  349 QTC Calculation: 459 R Axis:   75 Text Interpretation:  probale sinus tahcycaridia vs atrial flutter, poor quality ECG Probable LVH with secondary repol abnrm ST depression, consider ischemia, diffuse lds similar to prior ECG Reconfirmed by Juanjesus Pepperman  MD-J, Aniyha Tate ([id number]) on 10/22/2015 7:07:04 PM       MDM  Chads Vasc2 = 4.   Pt is rate controlled new onset a fib.  Bedside monitor suggestive of a fib. Will start on oral anticoagulant.  Consider cardizem cd 120.  Will discuss with cardiology but anticipate dc and follow up a fib clinic.    Dorie Rank, MD 10/22/15 [phone]

## 2015-10-22 NOTE — ED Provider Notes (Signed)
CSN: [id number]     Arrival date & time 10/22/15  1824 History   First MD Initiated Contact with Patient 10/22/15 1825     Chief Complaint  Patient presents with  . Shortness of Breath   The history is provided by the patient and medical records. No language interpreter was used.  Patient is a 75 year old female with past medical history of hypertension and diabetes who presents with 3 days of URI symptoms with nonproductive cough, SOB, congestion and rhinorrhea. Patient reports she has been taking NyQuil with minimal relief. She went to urgent care today for evaluation. She was found to be tachycardic and EKG performed there that showed concern for atrial fibrillation. Patient denies previous history of irregular heartbeat. She takes baby aspirin daily but is not otherwise anticoagulated. Denies chest pain, palpitations or lower extremity edema.  Past Medical History  Diagnosis Date  . Hypertension   . Cataract   . Diabetes mellitus without complication [facility])    Past Surgical History  Procedure Laterality Date  . Cholecystectomy    . Abdominal hysterectomy     No family history on file. Social History  Substance Use Topics  . Smoking status: Former Smoker    Quit date: 10/21/2013  . Smokeless tobacco: Current User     Comment: Quit 2 years ago - uses Secondary school teacher  . Alcohol Use: No   OB History    No data available     Review of Systems  Constitutional: Negative for fever and chills.  HENT: Positive for congestion and rhinorrhea.   Eyes: Negative for visual disturbance.  Respiratory: Positive for cough, shortness of breath and wheezing.   Cardiovascular: Negative for chest pain, palpitations and leg swelling.  Gastrointestinal: Negative for nausea, vomiting, abdominal pain, diarrhea and constipation.  Genitourinary: Negative for dysuria and difficulty urinating.  Musculoskeletal: Negative for back pain and neck pain.  Skin: Negative for pallor and rash.  Neurological: Negative  for dizziness and headaches.  Psychiatric/Behavioral: Negative for confusion.      Allergies  Latex  Home Medications   Prior to Admission medications   Medication Sig Start Date End Date Taking? Authorizing Provider  amLODipine (NORVASC) 5 MG tablet Take 5 mg by mouth daily.    Historical Provider, MD  B Complex-C (SUPER B COMPLEX PO) Take by mouth.    Historical Provider, MD  CALCIUM PO Take by mouth.    Historical Provider, MD  chlorpheniramine-HYDROcodone (TUSSIONEX PENNKINETIC ER) 10-8 MG/5ML SUER Take 5 mLs by mouth every 12 (twelve) hours as needed. 07/19/15   Robyn Haber, MD  Cholecalciferol (VITAMIN D-3) 1000 units CAPS Take by mouth.    Historical Provider, MD  levofloxacin (LEVAQUIN) 500 MG tablet Take 1 tablet (500 mg total) by mouth daily. 07/19/15   Robyn Haber, MD  linagliptin (TRADJENTA) 5 MG TABS tablet Take 5 mg by mouth daily.    Historical Provider, MD  Probiotic Product (PROBIOTIC DAILY PO) Take by mouth.    Historical Provider, MD  Psyllium (METAMUCIL) 28.3 % POWD Take by mouth.    Historical Provider, MD  rosuvastatin (CRESTOR) 10 MG tablet Take 10 mg by mouth daily.    Historical Provider, MD  valsartan (DIOVAN) 40 MG tablet Take 40 mg by mouth daily.    Historical Provider, MD   BP 169/88 mmHg  Pulse 107  Temp(Src) 97.7 F (36.5 C) (Oral)  Resp 21  Ht 5\' 2"  (1.575 m)  Wt 72.576 kg  BMI 29.26 kg/m2  SpO2 94% Physical Exam  Constitutional: She is oriented to person, place, and time. She appears well-developed and well-nourished. No distress.  HENT:  Head: Normocephalic and atraumatic.  Eyes: EOM are normal. Pupils are equal, round, and reactive to light.  Neck: Normal range of motion. Neck supple.  Cardiovascular: Intact distal pulses.  An irregularly irregular rhythm present. Tachycardia present.   Pulmonary/Chest: Effort normal. No respiratory distress. She has wheezes (scattered expiratory wheezes).  Abdominal: Soft. She exhibits no  distension. There is no tenderness.  Musculoskeletal: Normal range of motion. She exhibits no edema or tenderness.  Neurological: She is alert and oriented to person, place, and time.  Skin: Skin is warm and dry. No rash noted.  Psychiatric: She has a normal mood and affect.  Nursing note and vitals reviewed.   ED Course  Procedures (including critical care time) Labs Review Labs Reviewed  BASIC METABOLIC PANEL - Abnormal; Notable for the following:    Potassium 3.3 (*)    Glucose, Bld 144 (*)    Calcium 7.8 (*)    All other components within normal limits  CBC - Abnormal; Notable for the following:    Hemoglobin 11.8 (*)    MCH 25.5 (*)    All other components within normal limits  BRAIN NATRIURETIC PEPTIDE - Abnormal; Notable for the following:    B Natriuretic Peptide 271.8 (*)    All other components within normal limits  I-STAT TROPOININ, ED  I-STAT TROPOININ, ED  Randolm Idol, ED    Imaging Review Dg Chest 2 View  10/22/2015  CLINICAL DATA:  Shortness of breath for 1 week. EXAM: CHEST  2 VIEW COMPARISON:  September 28, 2010 FINDINGS: The heart size and mediastinal contours are within normal limits. There is mild diffuse increased pulmonary interstitium bilaterally. There is no focal pneumonia or pleural effusion. The visualized skeletal structures are unremarkable. IMPRESSION: Mild increased pulmonary interstitium bilaterally. This is nonspecific. This can be seen in interstitial edema or viral infection. Electronically Signed   By: Abelardo Diesel M.D.   On: 10/22/2015 19:59   I have personally reviewed and evaluated these images and lab results as part of my medical decision-making.   EKG Interpretation   Date/Time:  Wednesday Oct 22 2015 18:27:59 EDT Ventricular Rate:  104 PR Interval:    QRS Duration: 82 QT Interval:  349 QTC Calculation: 459 R Axis:   75 Text Interpretation:  probale sinus tahcycaridia vs atrial flutter, poor  quality ECG Probable LVH with  secondary repol abnrm ST depression,  consider ischemia, diffuse lds similar to prior ECG Reconfirmed by KNAPP   MD-J, JON ([id number]) on 10/22/2015 7:07:04 PM      MDM   Final diagnoses:  URI (upper respiratory infection)  Atrial fibrillation, new onset [facility])   Patient is a 75 year old female who presents from urgent care clinic for evaluation of possible new onset atrial fibrillation. On presentation patient is mildly tachycardic with heart rate in the 100s to 110s. Blood pressures are stable. EKG shows sinus tachycardia versus atrial flutter. Patient appears to be in atrial fibrillation on the cardiac monitor. On exam she is a well-appearing with scattered expiratory wheezes but no other significant findings. Hx of chest x-rays consistent with viral URI. Labs including CBC, troponin and BNP are unremarkable. Patient has mild hypokalemia potassium 3.3. Reassessment after labs and imaging patient continues to be in rate controlled atrial fibrillation on the monitor. She reports feeling much better and is tolerating by mouth. Timing of onset of her symptoms are unclear. Not  a candidate for cardioversion.  This patients CHA2DS2-VASc Score and unadjusted Ischemic Stroke Rate (% per year) is equal to 4.8 % stroke rate/year from a score of 4  Above score calculated as 1 point each if present [CHF, HTN, DM, Vascular=MI/PAD/Aortic Plaque, Age if 65-74, or Female] Above score calculated as 2 points each if present [Age > 75, or Stroke/TIA/TE]  Patient was started on several different anticoagulation as she is moderate risk for stroke secondary to atrial fibrillation.  Discussed this patient with Dr. Einar Gip, her cardiologist by telephone. He agrees with an regulation with a relative. Advised against starting rate control at this time as patient heart rate is in the 80s.    Findings were discussed with patient. She agrees with starting Xarelto. Risks and benefits of anticoagulation discussed and patient is  in agreement with this plan. Advised her to call her cardiologist tomorrow to set up close follow-up for reevaluation. Otherwise she may follow-up in the atrial fibrillation clinic. Discharged in stable condition. Prescription for Xarelto was given. Prescription for [facility] given for cough. Strict return precautions were discussed and patient in agreement with plan.  Patient seen and discussed with Dr. Tomi Bamberger, ED attending  Gibson Ramp, MD 10/23/15 [id number]  Dorie Rank, MD 10/23/15 1030

## 2015-10-29 ENCOUNTER — Encounter (HOSPITAL_COMMUNITY): Payer: Self-pay | Admitting: *Deleted

## 2015-12-12 DIAGNOSIS — M25572 Pain in left ankle and joints of left foot: Secondary | ICD-10-CM | POA: Diagnosis not present

## 2015-12-12 DIAGNOSIS — E784 Other hyperlipidemia: Secondary | ICD-10-CM | POA: Diagnosis not present

## 2015-12-12 DIAGNOSIS — M25579 Pain in unspecified ankle and joints of unspecified foot: Secondary | ICD-10-CM | POA: Diagnosis not present

## 2015-12-12 DIAGNOSIS — I10 Essential (primary) hypertension: Secondary | ICD-10-CM | POA: Diagnosis not present

## 2015-12-12 DIAGNOSIS — Z1389 Encounter for screening for other disorder: Secondary | ICD-10-CM | POA: Diagnosis not present

## 2015-12-12 DIAGNOSIS — M25571 Pain in right ankle and joints of right foot: Secondary | ICD-10-CM | POA: Diagnosis not present

## 2015-12-12 DIAGNOSIS — E119 Type 2 diabetes mellitus without complications: Secondary | ICD-10-CM | POA: Diagnosis not present

## 2016-01-16 DIAGNOSIS — I10 Essential (primary) hypertension: Secondary | ICD-10-CM | POA: Diagnosis not present

## 2016-01-16 DIAGNOSIS — F17290 Nicotine dependence, other tobacco product, uncomplicated: Secondary | ICD-10-CM | POA: Diagnosis not present

## 2016-01-16 DIAGNOSIS — I119 Hypertensive heart disease without heart failure: Secondary | ICD-10-CM | POA: Diagnosis not present

## 2016-01-16 DIAGNOSIS — E119 Type 2 diabetes mellitus without complications: Secondary | ICD-10-CM | POA: Diagnosis not present

## 2016-01-16 DIAGNOSIS — R600 Localized edema: Secondary | ICD-10-CM | POA: Diagnosis not present

## 2016-01-20 DIAGNOSIS — I119 Hypertensive heart disease without heart failure: Secondary | ICD-10-CM | POA: Diagnosis not present

## 2016-01-25 DIAGNOSIS — E119 Type 2 diabetes mellitus without complications: Secondary | ICD-10-CM | POA: Diagnosis not present

## 2016-01-25 DIAGNOSIS — M25579 Pain in unspecified ankle and joints of unspecified foot: Secondary | ICD-10-CM | POA: Diagnosis not present

## 2016-01-25 DIAGNOSIS — I10 Essential (primary) hypertension: Secondary | ICD-10-CM | POA: Diagnosis not present

## 2016-01-25 DIAGNOSIS — Z1389 Encounter for screening for other disorder: Secondary | ICD-10-CM | POA: Diagnosis not present

## 2016-01-25 DIAGNOSIS — E784 Other hyperlipidemia: Secondary | ICD-10-CM | POA: Diagnosis not present

## 2016-01-27 DIAGNOSIS — E785 Hyperlipidemia, unspecified: Secondary | ICD-10-CM | POA: Diagnosis not present

## 2016-01-27 DIAGNOSIS — I4891 Unspecified atrial fibrillation: Secondary | ICD-10-CM | POA: Diagnosis not present

## 2016-01-27 DIAGNOSIS — I119 Hypertensive heart disease without heart failure: Secondary | ICD-10-CM | POA: Diagnosis not present

## 2016-01-27 DIAGNOSIS — I4892 Unspecified atrial flutter: Secondary | ICD-10-CM | POA: Diagnosis not present

## 2016-02-04 ENCOUNTER — Other Ambulatory Visit: Payer: Self-pay | Admitting: Internal Medicine

## 2016-02-04 DIAGNOSIS — Z1231 Encounter for screening mammogram for malignant neoplasm of breast: Secondary | ICD-10-CM

## 2016-02-11 ENCOUNTER — Ambulatory Visit
Admission: RE | Admit: 2016-02-11 | Discharge: 2016-02-11 | Disposition: A | Discharge status: Home / Self Care | Payer: PPO | Source: Ambulatory Visit | Attending: Internal Medicine | Admitting: Internal Medicine

## 2016-02-11 DIAGNOSIS — Z1231 Encounter for screening mammogram for malignant neoplasm of breast: Secondary | ICD-10-CM | POA: Diagnosis not present

## 2016-02-24 DIAGNOSIS — I119 Hypertensive heart disease without heart failure: Secondary | ICD-10-CM | POA: Diagnosis not present

## 2016-02-24 DIAGNOSIS — E785 Hyperlipidemia, unspecified: Secondary | ICD-10-CM | POA: Diagnosis not present

## 2016-02-24 DIAGNOSIS — I480 Paroxysmal atrial fibrillation: Secondary | ICD-10-CM | POA: Diagnosis not present

## 2016-03-17 DIAGNOSIS — R600 Localized edema: Secondary | ICD-10-CM | POA: Diagnosis not present

## 2016-03-17 DIAGNOSIS — M1711 Unilateral primary osteoarthritis, right knee: Secondary | ICD-10-CM | POA: Diagnosis not present

## 2016-03-17 DIAGNOSIS — I10 Essential (primary) hypertension: Secondary | ICD-10-CM | POA: Diagnosis not present

## 2016-03-17 DIAGNOSIS — E1165 Type 2 diabetes mellitus with hyperglycemia: Secondary | ICD-10-CM | POA: Diagnosis not present

## 2016-03-17 DIAGNOSIS — M25579 Pain in unspecified ankle and joints of unspecified foot: Secondary | ICD-10-CM | POA: Diagnosis not present

## 2016-03-17 DIAGNOSIS — M85861 Other specified disorders of bone density and structure, right lower leg: Secondary | ICD-10-CM | POA: Diagnosis not present

## 2016-03-17 DIAGNOSIS — M7731 Calcaneal spur, right foot: Secondary | ICD-10-CM | POA: Diagnosis not present

## 2016-03-17 DIAGNOSIS — E784 Other hyperlipidemia: Secondary | ICD-10-CM | POA: Diagnosis not present

## 2016-03-17 DIAGNOSIS — M85871 Other specified disorders of bone density and structure, right ankle and foot: Secondary | ICD-10-CM | POA: Diagnosis not present

## 2016-03-17 DIAGNOSIS — Z23 Encounter for immunization: Secondary | ICD-10-CM | POA: Diagnosis not present

## 2016-03-17 DIAGNOSIS — M2011 Hallux valgus (acquired), right foot: Secondary | ICD-10-CM | POA: Diagnosis not present

## 2016-03-17 DIAGNOSIS — M19071 Primary osteoarthritis, right ankle and foot: Secondary | ICD-10-CM | POA: Diagnosis not present

## 2016-03-17 DIAGNOSIS — Z6829 Body mass index (BMI) 29.0-29.9, adult: Secondary | ICD-10-CM | POA: Diagnosis not present

## 2016-03-23 DIAGNOSIS — Z780 Asymptomatic menopausal state: Secondary | ICD-10-CM | POA: Diagnosis not present

## 2016-05-04 ENCOUNTER — Inpatient Hospital Stay (HOSPITAL_COMMUNITY)
Admission: EM | Admit: 2016-05-04 | Discharge: 2016-05-08 | DRG: 023 | Disposition: A | Discharge status: Home Health | Payer: PPO | Attending: Neurology | Admitting: Neurology

## 2016-05-04 ENCOUNTER — Emergency Department (HOSPITAL_COMMUNITY): Payer: PPO | Admitting: Certified Registered Nurse Anesthetist

## 2016-05-04 ENCOUNTER — Inpatient Hospital Stay (HOSPITAL_COMMUNITY): Payer: PPO

## 2016-05-04 ENCOUNTER — Emergency Department (HOSPITAL_COMMUNITY): Payer: PPO

## 2016-05-04 ENCOUNTER — Encounter (HOSPITAL_COMMUNITY): Payer: Self-pay | Admitting: Radiology

## 2016-05-04 ENCOUNTER — Encounter (HOSPITAL_COMMUNITY)
Admission: EM | Disposition: A | Discharge status: Home Health | Payer: Self-pay | Source: Home / Self Care | Attending: Neurology

## 2016-05-04 DIAGNOSIS — H5347 Heteronymous bilateral field defects: Secondary | ICD-10-CM | POA: Diagnosis present

## 2016-05-04 DIAGNOSIS — I63512 Cerebral infarction due to unspecified occlusion or stenosis of left middle cerebral artery: Secondary | ICD-10-CM | POA: Diagnosis not present

## 2016-05-04 DIAGNOSIS — Z87891 Personal history of nicotine dependence: Secondary | ICD-10-CM | POA: Diagnosis not present

## 2016-05-04 DIAGNOSIS — J9600 Acute respiratory failure, unspecified whether with hypoxia or hypercapnia: Secondary | ICD-10-CM | POA: Diagnosis not present

## 2016-05-04 DIAGNOSIS — I63412 Cerebral infarction due to embolism of left middle cerebral artery: Principal | ICD-10-CM | POA: Diagnosis present

## 2016-05-04 DIAGNOSIS — E785 Hyperlipidemia, unspecified: Secondary | ICD-10-CM | POA: Diagnosis not present

## 2016-05-04 DIAGNOSIS — I6300 Cerebral infarction due to thrombosis of unspecified precerebral artery: Secondary | ICD-10-CM

## 2016-05-04 DIAGNOSIS — G8191 Hemiplegia, unspecified affecting right dominant side: Secondary | ICD-10-CM | POA: Diagnosis present

## 2016-05-04 DIAGNOSIS — Y92002 Bathroom of unspecified non-institutional (private) residence single-family (private) house as the place of occurrence of the external cause: Secondary | ICD-10-CM | POA: Diagnosis not present

## 2016-05-04 DIAGNOSIS — E876 Hypokalemia: Secondary | ICD-10-CM | POA: Diagnosis not present

## 2016-05-04 DIAGNOSIS — I10 Essential (primary) hypertension: Secondary | ICD-10-CM | POA: Diagnosis present

## 2016-05-04 DIAGNOSIS — I4891 Unspecified atrial fibrillation: Secondary | ICD-10-CM | POA: Diagnosis present

## 2016-05-04 DIAGNOSIS — W19XXXA Unspecified fall, initial encounter: Secondary | ICD-10-CM | POA: Diagnosis not present

## 2016-05-04 DIAGNOSIS — J9690 Respiratory failure, unspecified, unspecified whether with hypoxia or hypercapnia: Secondary | ICD-10-CM | POA: Diagnosis not present

## 2016-05-04 DIAGNOSIS — I638 Other cerebral infarction: Secondary | ICD-10-CM | POA: Diagnosis not present

## 2016-05-04 DIAGNOSIS — E1159 Type 2 diabetes mellitus with other circulatory complications: Secondary | ICD-10-CM | POA: Diagnosis not present

## 2016-05-04 DIAGNOSIS — Z7901 Long term (current) use of anticoagulants: Secondary | ICD-10-CM | POA: Diagnosis not present

## 2016-05-04 DIAGNOSIS — H269 Unspecified cataract: Secondary | ICD-10-CM | POA: Diagnosis present

## 2016-05-04 DIAGNOSIS — I6789 Other cerebrovascular disease: Secondary | ICD-10-CM | POA: Diagnosis not present

## 2016-05-04 DIAGNOSIS — R4701 Aphasia: Secondary | ICD-10-CM | POA: Diagnosis present

## 2016-05-04 DIAGNOSIS — I639 Cerebral infarction, unspecified: Secondary | ICD-10-CM | POA: Diagnosis not present

## 2016-05-04 DIAGNOSIS — F17210 Nicotine dependence, cigarettes, uncomplicated: Secondary | ICD-10-CM | POA: Diagnosis not present

## 2016-05-04 DIAGNOSIS — R0989 Other specified symptoms and signs involving the circulatory and respiratory systems: Secondary | ICD-10-CM | POA: Diagnosis not present

## 2016-05-04 DIAGNOSIS — I629 Nontraumatic intracranial hemorrhage, unspecified: Secondary | ICD-10-CM | POA: Diagnosis not present

## 2016-05-04 DIAGNOSIS — G839 Paralytic syndrome, unspecified: Secondary | ICD-10-CM | POA: Diagnosis not present

## 2016-05-04 DIAGNOSIS — R531 Weakness: Secondary | ICD-10-CM

## 2016-05-04 DIAGNOSIS — Z91040 Latex allergy status: Secondary | ICD-10-CM

## 2016-05-04 DIAGNOSIS — I482 Chronic atrial fibrillation: Secondary | ICD-10-CM | POA: Diagnosis not present

## 2016-05-04 DIAGNOSIS — E669 Obesity, unspecified: Secondary | ICD-10-CM | POA: Diagnosis not present

## 2016-05-04 DIAGNOSIS — S199XXA Unspecified injury of neck, initial encounter: Secondary | ICD-10-CM | POA: Diagnosis not present

## 2016-05-04 DIAGNOSIS — Z888 Allergy status to other drugs, medicaments and biological substances status: Secondary | ICD-10-CM

## 2016-05-04 DIAGNOSIS — E119 Type 2 diabetes mellitus without complications: Secondary | ICD-10-CM | POA: Diagnosis present

## 2016-05-04 DIAGNOSIS — Z4682 Encounter for fitting and adjustment of non-vascular catheter: Secondary | ICD-10-CM | POA: Diagnosis not present

## 2016-05-04 DIAGNOSIS — Q251 Coarctation of aorta: Secondary | ICD-10-CM

## 2016-05-04 DIAGNOSIS — I480 Paroxysmal atrial fibrillation: Secondary | ICD-10-CM | POA: Diagnosis not present

## 2016-05-04 DIAGNOSIS — R4781 Slurred speech: Secondary | ICD-10-CM | POA: Diagnosis not present

## 2016-05-04 DIAGNOSIS — I63312 Cerebral infarction due to thrombosis of left middle cerebral artery: Secondary | ICD-10-CM | POA: Diagnosis present

## 2016-05-04 DIAGNOSIS — M6281 Muscle weakness (generalized): Secondary | ICD-10-CM | POA: Diagnosis not present

## 2016-05-04 DIAGNOSIS — R29810 Facial weakness: Secondary | ICD-10-CM | POA: Diagnosis not present

## 2016-05-04 DIAGNOSIS — I6602 Occlusion and stenosis of left middle cerebral artery: Secondary | ICD-10-CM | POA: Diagnosis not present

## 2016-05-04 HISTORY — PX: IR GENERIC HISTORICAL: IMG1180011

## 2016-05-04 HISTORY — PX: RADIOLOGY WITH ANESTHESIA: SHX6223

## 2016-05-04 LAB — BLOOD GAS, ARTERIAL
ACID-BASE DEFICIT: 0.3 mmol/L (ref 0.0–2.0)
Bicarbonate: 22.9 mmol/L (ref 20.0–28.0)
Drawn by: 406621
FIO2: 60
O2 Saturation: 99.5 %
PATIENT TEMPERATURE: 96.8
PCO2 ART: 30.2 mmHg — AB (ref 32.0–48.0)
PEEP/CPAP: 5 cmH2O
PH ART: 7.487 — AB (ref 7.350–7.450)
RATE: 14 resp/min
VT: 580 mL
pO2, Arterial: 188 mmHg — ABNORMAL HIGH (ref 83.0–108.0)

## 2016-05-04 LAB — CBC
HEMATOCRIT: 34 % — AB (ref 36.0–46.0)
HEMOGLOBIN: 11.2 g/dL — AB (ref 12.0–15.0)
MCH: 26.7 pg (ref 26.0–34.0)
MCHC: 32.9 g/dL (ref 30.0–36.0)
MCV: 81.1 fL (ref 78.0–100.0)
Platelets: 273 10*3/uL (ref 150–400)
RBC: 4.19 MIL/uL (ref 3.87–5.11)
RDW: 13.8 % (ref 11.5–15.5)
WBC: 10.1 10*3/uL (ref 4.0–10.5)

## 2016-05-04 LAB — DIFFERENTIAL
Basophils Absolute: 0 10*3/uL (ref 0.0–0.1)
Basophils Relative: 0 %
Eosinophils Absolute: 0.1 10*3/uL (ref 0.0–0.7)
Eosinophils Relative: 1 %
LYMPHS PCT: 8 %
Lymphs Abs: 0.8 10*3/uL (ref 0.7–4.0)
MONO ABS: 0.5 10*3/uL (ref 0.1–1.0)
MONOS PCT: 5 %
NEUTROS ABS: 8.7 10*3/uL — AB (ref 1.7–7.7)
Neutrophils Relative %: 86 %

## 2016-05-04 LAB — CBG MONITORING, ED: Glucose-Capillary: 175 mg/dL — ABNORMAL HIGH (ref 65–99)

## 2016-05-04 LAB — COMPREHENSIVE METABOLIC PANEL
ALK PHOS: 63 U/L (ref 38–126)
ALT: 19 U/L (ref 14–54)
AST: 27 U/L (ref 15–41)
Albumin: 3.6 g/dL (ref 3.5–5.0)
Anion gap: 11 (ref 5–15)
BUN: 7 mg/dL (ref 6–20)
CALCIUM: 7.8 mg/dL — AB (ref 8.9–10.3)
CO2: 25 mmol/L (ref 22–32)
CREATININE: 0.67 mg/dL (ref 0.44–1.00)
Chloride: 103 mmol/L (ref 101–111)
Glucose, Bld: 182 mg/dL — ABNORMAL HIGH (ref 65–99)
Potassium: 3.1 mmol/L — ABNORMAL LOW (ref 3.5–5.1)
Sodium: 139 mmol/L (ref 135–145)
Total Bilirubin: 0.6 mg/dL (ref 0.3–1.2)
Total Protein: 7 g/dL (ref 6.5–8.1)

## 2016-05-04 LAB — URINALYSIS, ROUTINE W REFLEX MICROSCOPIC
Bacteria, UA: NONE SEEN
Bilirubin Urine: NEGATIVE
GLUCOSE, UA: NEGATIVE mg/dL
Ketones, ur: NEGATIVE mg/dL
NITRITE: NEGATIVE
PH: 5 (ref 5.0–8.0)
PROTEIN: NEGATIVE mg/dL
SPECIFIC GRAVITY, URINE: 1.016 (ref 1.005–1.030)
Squamous Epithelial / LPF: NONE SEEN

## 2016-05-04 LAB — CBC WITH DIFFERENTIAL/PLATELET
BASOS PCT: 0 %
Basophils Absolute: 0 10*3/uL (ref 0.0–0.1)
EOS ABS: 0 10*3/uL (ref 0.0–0.7)
Eosinophils Relative: 1 %
HCT: 30.4 % — ABNORMAL LOW (ref 36.0–46.0)
HEMOGLOBIN: 10 g/dL — AB (ref 12.0–15.0)
Lymphocytes Relative: 13 %
Lymphs Abs: 0.9 10*3/uL (ref 0.7–4.0)
MCH: 26.5 pg (ref 26.0–34.0)
MCHC: 32.9 g/dL (ref 30.0–36.0)
MCV: 80.6 fL (ref 78.0–100.0)
MONO ABS: 0.4 10*3/uL (ref 0.1–1.0)
MONOS PCT: 6 %
NEUTROS PCT: 80 %
Neutro Abs: 5.5 10*3/uL (ref 1.7–7.7)
Platelets: 241 10*3/uL (ref 150–400)
RBC: 3.77 MIL/uL — ABNORMAL LOW (ref 3.87–5.11)
RDW: 13.7 % (ref 11.5–15.5)
WBC: 6.9 10*3/uL (ref 4.0–10.5)

## 2016-05-04 LAB — RAPID URINE DRUG SCREEN, HOSP PERFORMED
AMPHETAMINES: NOT DETECTED
BARBITURATES: NOT DETECTED
Benzodiazepines: NOT DETECTED
Cocaine: NOT DETECTED
Opiates: NOT DETECTED
Tetrahydrocannabinol: NOT DETECTED

## 2016-05-04 LAB — BASIC METABOLIC PANEL
ANION GAP: 6 (ref 5–15)
BUN: 6 mg/dL (ref 6–20)
CHLORIDE: 107 mmol/L (ref 101–111)
CO2: 27 mmol/L (ref 22–32)
Calcium: 6.9 mg/dL — ABNORMAL LOW (ref 8.9–10.3)
Creatinine, Ser: 0.52 mg/dL (ref 0.44–1.00)
GFR calc Af Amer: >60 mL/min (ref >60)
GFR calc non Af Amer: >60 mL/min (ref >60)
GLUCOSE: 128 mg/dL — AB (ref 65–99)
POTASSIUM: 2.9 mmol/L — AB (ref 3.5–5.1)
Sodium: 140 mmol/L (ref 135–145)

## 2016-05-04 LAB — TROPONIN I: TROPONIN I: 0.04 ng/mL — AB (ref <0.03)

## 2016-05-04 LAB — GLUCOSE, CAPILLARY
Glucose-Capillary: 106 mg/dL — ABNORMAL HIGH (ref 65–99)
Glucose-Capillary: 115 mg/dL — ABNORMAL HIGH (ref 65–99)
Glucose-Capillary: 117 mg/dL — ABNORMAL HIGH (ref 65–99)
Glucose-Capillary: 108 mg/dL — ABNORMAL HIGH (ref 65–99)

## 2016-05-04 LAB — PROTIME-INR
INR: 1.05
Prothrombin Time: 13.7 seconds (ref 11.4–15.2)

## 2016-05-04 LAB — I-STAT CHEM 8, ED
BUN: 7 mg/dL (ref 6–20)
CALCIUM ION: 0.92 mmol/L — AB (ref 1.15–1.40)
CHLORIDE: 99 mmol/L — AB (ref 101–111)
Creatinine, Ser: 0.7 mg/dL (ref 0.44–1.00)
GLUCOSE: 183 mg/dL — AB (ref 65–99)
HCT: 34 % — ABNORMAL LOW (ref 36.0–46.0)
Hemoglobin: 11.6 g/dL — ABNORMAL LOW (ref 12.0–15.0)
Potassium: 3.2 mmol/L — ABNORMAL LOW (ref 3.5–5.1)
Sodium: 140 mmol/L (ref 135–145)
TCO2: 26 mmol/L (ref 0–100)

## 2016-05-04 LAB — MRSA PCR SCREENING: MRSA by PCR: NEGATIVE

## 2016-05-04 LAB — I-STAT TROPONIN, ED: Troponin i, poc: 0.01 ng/mL (ref 0.00–0.08)

## 2016-05-04 LAB — ETHANOL: Alcohol, Ethyl (B): <5 mg/dL (ref <5)

## 2016-05-04 LAB — APTT: aPTT: 29 seconds (ref 24–36)

## 2016-05-04 SURGERY — RADIOLOGY WITH ANESTHESIA
Anesthesia: Monitor Anesthesia Care

## 2016-05-04 MED ORDER — ONDANSETRON HCL 4 MG/2ML IJ SOLN: 4.0000 mg | Freq: Once | INTRAMUSCULAR | Status: DC | PRN

## 2016-05-04 MED ORDER — ONDANSETRON HCL 4 MG/2ML IJ SOLN: 4.0000 mg | Freq: Four times a day (QID) | INTRAMUSCULAR | Status: DC | PRN

## 2016-05-04 MED ORDER — SODIUM CHLORIDE 0.9 % IV SOLN
INTRAVENOUS | Status: DC | PRN
  Administered 2016-05-04: 10:00:00 via INTRAVENOUS

## 2016-05-04 MED ORDER — STROKE: EARLY STAGES OF RECOVERY BOOK
Freq: Once | Status: AC
  Administered 2016-05-05: 06:00:00
  Filled 2016-05-04: qty 1

## 2016-05-04 MED ORDER — FENTANYL CITRATE (PF) 100 MCG/2ML IJ SOLN: 50.0000 ug | INTRAMUSCULAR | Status: DC | PRN

## 2016-05-04 MED ORDER — FENTANYL CITRATE (PF) 100 MCG/2ML IJ SOLN
INTRAMUSCULAR | Status: AC
  Administered 2016-05-04: 14:00:00
  Filled 2016-05-04: qty 2

## 2016-05-04 MED ORDER — FENTANYL CITRATE (PF) 100 MCG/2ML IJ SOLN: 25.0000 ug | INTRAMUSCULAR | Status: DC | PRN

## 2016-05-04 MED ORDER — LIDOCAINE HCL 1 % IJ SOLN
INTRAMUSCULAR | Status: AC
  Filled 2016-05-04: qty 20

## 2016-05-04 MED ORDER — PHENYLEPHRINE HCL 10 MG/ML IJ SOLN
INTRAVENOUS | Status: DC | PRN
  Administered 2016-05-04: 25 ug/min via INTRAVENOUS

## 2016-05-04 MED ORDER — ACETAMINOPHEN 160 MG/5ML PO SOLN: 650.0000 mg | ORAL | Status: DC | PRN

## 2016-05-04 MED ORDER — PROPOFOL 500 MG/50ML IV EMUL
INTRAVENOUS | Status: DC | PRN
  Administered 2016-05-04: 75 ug/kg/min via INTRAVENOUS

## 2016-05-04 MED ORDER — FENTANYL CITRATE (PF) 100 MCG/2ML IJ SOLN
INTRAMUSCULAR | Status: DC | PRN
  Administered 2016-05-04: 75 ug via INTRAVENOUS
  Administered 2016-05-04: 25 ug via INTRAVENOUS

## 2016-05-04 MED ORDER — ACETAMINOPHEN 325 MG PO TABS: 650.0000 mg | ORAL_TABLET | ORAL | Status: DC | PRN

## 2016-05-04 MED ORDER — ACETAMINOPHEN 650 MG RE SUPP: 650.0000 mg | RECTAL | Status: DC | PRN

## 2016-05-04 MED ORDER — IOPAMIDOL (ISOVUE-300) INJECTION 61%
INTRAVENOUS | Status: AC
  Administered 2016-05-04: 50 mL
  Filled 2016-05-04: qty 300

## 2016-05-04 MED ORDER — NITROGLYCERIN 1 MG/10 ML FOR IR/CATH LAB
INTRA_ARTERIAL | Status: AC
  Administered 2016-05-04: 25 ug
  Filled 2016-05-04: qty 10

## 2016-05-04 MED ORDER — SUCCINYLCHOLINE CHLORIDE 20 MG/ML IJ SOLN
INTRAMUSCULAR | Status: DC | PRN
  Administered 2016-05-04: 120 mg via INTRAVENOUS

## 2016-05-04 MED ORDER — SODIUM CHLORIDE 0.9 % IV SOLN
INTRAVENOUS | Status: DC
  Administered 2016-05-04 (×2): via INTRAVENOUS

## 2016-05-04 MED ORDER — PROPOFOL 1000 MG/100ML IV EMUL
5.0000 ug/kg/min | INTRAVENOUS | Status: AC
  Administered 2016-05-04: 50 ug/kg/min via INTRAVENOUS
  Administered 2016-05-04: 40 ug/kg/min via INTRAVENOUS
  Administered 2016-05-05: 30 ug/kg/min via INTRAVENOUS
  Administered 2016-05-05: 35 ug/kg/min via INTRAVENOUS
  Filled 2016-05-04 (×3): qty 100

## 2016-05-04 MED ORDER — PANTOPRAZOLE SODIUM 40 MG IV SOLR
40.0000 mg | INTRAVENOUS | Status: DC
  Administered 2016-05-04 – 2016-05-05 (×2): 40 mg via INTRAVENOUS
  Filled 2016-05-04 (×2): qty 40

## 2016-05-04 MED ORDER — NICARDIPINE HCL IN NACL 20-0.86 MG/200ML-% IV SOLN
5.0000 mg/h | INTRAVENOUS | Status: DC
  Administered 2016-05-04: 2.5 mg/h via INTRAVENOUS
  Filled 2016-05-04: qty 200

## 2016-05-04 MED ORDER — ORAL CARE MOUTH RINSE
15.0000 mL | Freq: Four times a day (QID) | OROMUCOSAL | Status: DC
  Administered 2016-05-04: 15 mL via OROMUCOSAL

## 2016-05-04 MED ORDER — ROCURONIUM BROMIDE 100 MG/10ML IV SOLN
INTRAVENOUS | Status: DC | PRN
  Administered 2016-05-04: 30 mg via INTRAVENOUS

## 2016-05-04 MED ORDER — POTASSIUM CHLORIDE 20 MEQ/15ML (10%) PO SOLN: 40.0000 meq | Freq: Once | ORAL | Status: AC

## 2016-05-04 MED ORDER — CEFAZOLIN SODIUM-DEXTROSE 2-4 GM/100ML-% IV SOLN
INTRAVENOUS | Status: AC
  Filled 2016-05-04: qty 100

## 2016-05-04 MED ORDER — ORAL CARE MOUTH RINSE
15.0000 mL | OROMUCOSAL | Status: DC
  Administered 2016-05-05 (×10): 15 mL via OROMUCOSAL

## 2016-05-04 MED ORDER — PROPOFOL 10 MG/ML IV BOLUS
INTRAVENOUS | Status: DC | PRN
  Administered 2016-05-04: 140 mg via INTRAVENOUS

## 2016-05-04 MED ORDER — CEFAZOLIN SODIUM-DEXTROSE 2-3 GM-% IV SOLR
INTRAVENOUS | Status: DC | PRN
  Administered 2016-05-04: 2 g via INTRAVENOUS

## 2016-05-04 MED ORDER — IOPAMIDOL (ISOVUE-370) INJECTION 76%
50.0000 mL | Freq: Once | INTRAVENOUS | Status: AC | PRN
  Administered 2016-05-04: 50 mL via INTRAVENOUS

## 2016-05-04 MED ORDER — PROPOFOL 1000 MG/100ML IV EMUL
INTRAVENOUS | Status: AC
  Filled 2016-05-04: qty 100

## 2016-05-04 MED ORDER — INSULIN ASPART 100 UNIT/ML ~~LOC~~ SOLN
2.0000 [IU] | SUBCUTANEOUS | Status: DC
  Administered 2016-05-05 (×3): 2 [IU] via SUBCUTANEOUS

## 2016-05-04 MED ORDER — FENTANYL CITRATE (PF) 100 MCG/2ML IJ SOLN
INTRAMUSCULAR | Status: AC
  Administered 2016-05-04: 50 ug
  Filled 2016-05-04: qty 2

## 2016-05-04 MED ORDER — LIDOCAINE HCL (CARDIAC) 20 MG/ML IV SOLN
INTRAVENOUS | Status: DC | PRN
  Administered 2016-05-04: 60 mg via INTRAVENOUS

## 2016-05-04 MED ORDER — CHLORHEXIDINE GLUCONATE 0.12% ORAL RINSE (MEDLINE KIT)
15.0000 mL | Freq: Two times a day (BID) | OROMUCOSAL | Status: DC
  Administered 2016-05-04 – 2016-05-05 (×3): 15 mL via OROMUCOSAL

## 2016-05-04 MED ORDER — EPTIFIBATIDE 20 MG/10ML IV SOLN
INTRAVENOUS | Status: AC
  Administered 2016-05-04 (×2): 1.8 mg
  Filled 2016-05-04: qty 10

## 2016-05-04 NOTE — Progress Notes (Signed)
CRITICAL VALUE ALERT  Critical value received:  Troponin 0.04  Date of notification:  05/04/16  Time of notification:  1400  Critical value read back: Yes  Nurse who received alert:  Thayer Ohm D  MD notified (1st page):  ELink  Time of first page:  1500  MD notified (2nd page):  Time of second page:  Responding MD:  Warren Lacy  Time MD responded:  1500

## 2016-05-04 NOTE — ED Notes (Signed)
Patient on blood thinners

## 2016-05-04 NOTE — Progress Notes (Signed)
Transported pt from CT 2 to 3M14 on ventilator. Pt stable throughout with no complications. RT will continue to monitor.

## 2016-05-04 NOTE — Anesthesia Preprocedure Evaluation (Signed)
Anesthesia Evaluation   Patient confused    Reviewed: Patient's Chart, lab work & pertinent test results, Unable to perform ROS - Chart review only  Airway Mallampati: III       Dental  (+) Partial Upper   Pulmonary former smoker,    breath sounds clear to auscultation       Cardiovascular hypertension,  Rhythm:Irregular Rate:Normal     Neuro/Psych    GI/Hepatic   Endo/Other  diabetes  Renal/GU      Musculoskeletal   Abdominal   Peds  Hematology   Anesthesia Other Findings   Reproductive/Obstetrics                             Anesthesia Physical Anesthesia Plan  ASA: IV and emergent  Anesthesia Plan: General   Post-op Pain Management:    Induction: Intravenous, Rapid sequence and Cricoid pressure planned  Airway Management Planned: Oral ETT  Additional Equipment:   Intra-op Plan:   Post-operative Plan: Post-operative intubation/ventilation  Informed Consent: I have reviewed the patients History and Physical, chart, labs and discussed the procedure including the risks, benefits and alternatives for the proposed anesthesia with the patient or authorized representative who has indicated his/her understanding and acceptance.     Plan Discussed with: CRNA and Anesthesiologist  Anesthesia Plan Comments:         Anesthesia Quick Evaluation

## 2016-05-04 NOTE — Anesthesia Procedure Notes (Signed)
Procedure Name: Intubation Date/Time: 05/04/2016 10:26 AM Performed by: Oletta Lamas Pre-anesthesia Checklist: Patient identified, Emergency Drugs available, Suction available and Patient being monitored Patient Re-evaluated:Patient Re-evaluated prior to inductionOxygen Delivery Method: Circle System Utilized Preoxygenation: Pre-oxygenation with 100% oxygen Intubation Type: IV induction, Rapid sequence and Cricoid Pressure applied Ventilation: Mask ventilation without difficulty Laryngoscope Size: Mac and 3 Grade View: Grade I Tube type: Oral Tube size: 7.5 mm Number of attempts: 1 Airway Equipment and Method: Stylet and Oral airway Placement Confirmation: ETT inserted through vocal cords under direct vision,  positive ETCO2 and breath sounds checked- equal and bilateral Secured at: 22 cm Tube secured with: Tape Dental Injury: Teeth and Oropharynx as per pre-operative assessment

## 2016-05-04 NOTE — Progress Notes (Signed)
[place] Progress Note Patient Name: Diana Cervantes DOB: 10-May-1941 MRN: [id number]   Date of Service  05/04/2016  HPI/Events of Note  Agitation  eICU Interventions  Fentanyl PRN     Intervention Category Major Interventions: Other:  YACOUB,WESAM 05/04/2016, 4:18 PM

## 2016-05-04 NOTE — Consult Note (Signed)
PULMONARY / CRITICAL CARE MEDICINE   Name: Diana Cervantes MRN: [id number] DOB: [birth year]    ADMISSION DATE:  05/04/2016 CONSULTATION DATE:  12/5/167  REFERRING MD:  Leonel Ramsay, MD   CHIEF COMPLAINT:  Acute resp failure, stroke  HISTORY OF PRESENT ILLNESS:   Diana Cervantes is a 75 y.o. female with a historyi of diabetes and hypertension who takes Xarelto for atrial fibrillation. She was in her normal state of health at 6 AM when the son talked to her this morning.  She fell in the bathroom. Her son found her on the ground, with severe right hemiparesis and aphasia. EMS was called and the patient was transported as a code stroke. She was taken for a CT angiogram which demonstrated a left MCA occlusion. She is not a candidate for TPA due to anticoagulation, but was taken for mechanical intervention. She had a successful left MCA revascularization procedure and remained on ventilation post op.  Sh eof course ws intubated for the procedure, Also just had a repeat Ct chead. She was on neo for a short period of time but arrived off.  PAST MEDICAL HISTORY :  She  has a past medical history of Cataract; Diabetes mellitus without complication ([place]); and Hypertension.  PAST SURGICAL HISTORY: She  has a past surgical history that includes Cholecystectomy and Abdominal hysterectomy.  Allergies  Allergen Reactions  . Latex     No current facility-administered medications on file prior to encounter.    Current Outpatient Prescriptions on File Prior to Encounter  Medication Sig  . amLODipine (NORVASC) 5 MG tablet Take 5 mg by mouth daily.  . B Complex-C (SUPER B COMPLEX PO) Take 1 tablet by mouth daily.   . benzonatate (TESSALON) 100 MG capsule Take 1 capsule (100 mg total) by mouth every 8 (eight) hours.  Marland Kitchen CALCIUM PO Take 1 tablet by mouth daily.   . Cholecalciferol (VITAMIN D-3) 1000 units CAPS Take 1,000 Units by mouth daily.   Marland Kitchen linagliptin (TRADJENTA) 5 MG TABS tablet Take 5 mg by  mouth daily.  . Probiotic Product (PROBIOTIC DAILY PO) Take 1 tablet by mouth daily.   . rivaroxaban (XARELTO) 20 MG TABS tablet Take 1 tablet (20 mg total) by mouth daily with supper.  . rosuvastatin (CRESTOR) 10 MG tablet Take 10 mg by mouth daily.  . valsartan (DIOVAN) 40 MG tablet Take 40 mg by mouth daily.  . valsartan-hydrochlorothiazide (DIOVAN-HCT) 160-25 MG tablet Take 1 tablet by mouth daily.    FAMILY HISTORY:  Her has no family status information on file.    SOCIAL HISTORY: She  reports that she quit smoking about 2 years ago. She uses smokeless tobacco. She reports that she does not drink alcohol or use drugs.  REVIEW OF SYSTEMS:   Unable, vent  SUBJECTIVE:  Post IR, vent, calm  VITAL SIGNS: BP (!) 128/59   Pulse 82   Resp 14   Wt 73.6 kg (162 lb 4.1 oz)   SpO2 100%   BMI 29.68 kg/m   HEMODYNAMICS:    VENTILATOR SETTINGS: Vent Mode: PRVC FiO2 (%):  [60 %] 60 % Set Rate:  [14 bmp] 14 bmp Vt Set:  [580 mL] 580 mL PEEP:  [5 cmH20] 5 cmH20 Plateau Pressure:  [16 cmH20] 16 cmH20  INTAKE / OUTPUT: No intake/output data recorded.  PHYSICAL EXAMINATION: General:  Not awake, calm on vent Neuro:  perr 2-3 mm moved left upper ext and lower with pain HEENT:  jvd wnl, ett Cardiovascular:  s1 s2 RRR distant Lungs:  Coarse to clear  Abdomen:  Soft, obese, NT, nd, no r Musculoskeletal:  Min edema Skin:  No rash  LABS:  BMET  Recent Labs Lab 05/04/16 0934 05/04/16 0940  NA 139 140  K 3.1* 3.2*  CL 103 99*  CO2 25  --   BUN 7 7  CREATININE 0.67 0.70  GLUCOSE 182* 183*    Electrolytes  Recent Labs Lab 05/04/16 0934  CALCIUM 7.8*    CBC  Recent Labs Lab 05/04/16 0934 05/04/16 0940  WBC 10.1  --   HGB 11.2* 11.6*  HCT 34.0* 34.0*  PLT 273  --     Coag's  Recent Labs Lab 05/04/16 0934  APTT 29  INR 1.05    Sepsis Markers No results for input(s): LATICACIDVEN, PROCALCITON, O2SATVEN in the last 168 hours.  ABG No results for  input(s): PHART, PCO2ART, PO2ART in the last 168 hours.  Liver Enzymes  Recent Labs Lab 05/04/16 0934  AST 27  ALT 19  ALKPHOS 63  BILITOT 0.6  ALBUMIN 3.6    Cardiac Enzymes No results for input(s): TROPONINI, PROBNP in the last 168 hours.  Glucose  Recent Labs Lab 05/04/16 0934  GLUCAP 175*    Imaging Ct Angio Head W Or Wo Contrast  Result Date: 05/04/2016 CLINICAL DATA:  Right-sided weakness.  Stroke. EXAM: CT ANGIOGRAPHY HEAD AND NECK TECHNIQUE: Multidetector CT imaging of the head and neck was performed using the standard protocol during bolus administration of intravenous contrast. Multiplanar CT image reconstructions and MIPs were obtained to evaluate the vascular anatomy. Carotid stenosis measurements (when applicable) are obtained utilizing NASCET criteria, using the distal internal carotid diameter as the denominator. CONTRAST:  50 mL Isovue 370 IV COMPARISON:  CT head 05/04/2016 FINDINGS: CTA NECK FINDINGS Aortic arch: Atherosclerotic calcification in the aortic arch. Negative for dissection or aneurysm. Proximal great vessels widely patent. Right carotid system: Right common carotid artery widely patent. Mild atherosclerotic disease of the right carotid bifurcation without significant stenosis Left carotid system: Left common carotid artery widely patent. Mild atherosclerotic calcification at the carotid bifurcation. Less than 25% diameter stenosis left internal carotid artery. Vertebral arteries: Codominant vertebral arteries. No significant vertebral artery stenosis. Both vertebral arteries patent to the basilar without stenosis. Skeleton: Mild degenerative changes in the cervical spine. No acute skeletal abnormality. Other neck: 13 mm left thyroid nodule. No pathologic adenopathy in the neck. Upper chest: Lung apices clear. Review of the MIP images confirms the above findings CTA HEAD FINDINGS Anterior circulation: Mild atherosclerotic calcification in the cavernous carotid  bilaterally causing mild stenosis. Right anterior and right middle cerebral arteries widely patent. Terminal left internal carotid artery widely patent. Left anterior cerebral artery widely patent. Occlusion of the left M1 segment with hypoperfusion throughout the left MCA territory. Posterior circulation: Both vertebral arteries contribute to the basilar. Basilar widely patent. PICA, AICA, superior cerebellar, and posterior cerebral arteries widely patent without stenosis. Venous sinuses: Suboptimal venous opacification due to timing. No evidence of venous sinus thrombosis. Anatomic variants: None Delayed phase: Not perform Review of the MIP images confirms the above findings IMPRESSION: Occlusion left M1 segment with hypoperfusion throughout the left MCA territory. No other intracranial stenosis Mild atherosclerotic disease in the carotid bifurcation bilaterally without significant carotid stenosis. Electronically Signed   By: Franchot Gallo M.D.   On: 05/04/2016 10:31   Ct Head Wo Contrast  Result Date: 05/04/2016 CLINICAL DATA:  75 year old female with right-sided weakness post intervention for thrombus  M1 segment. EXAM: CT HEAD WITHOUT CONTRAST TECHNIQUE: Contiguous axial images were obtained from the base of the skull through the vertex without intravenous contrast. COMPARISON:  Catheter angiogram, CT angiogram and head CT 05/04/2016. FINDINGS: Brain: Hyperdense material seen within the superior aspect of the left major fissure. This may represent contrast or blood from recent intervention. Posterior aspect of the left lenticular nucleus was previously noted to be slightly hypodense which may indicate acute infarct. This area now demonstrates mild enhancement post angiography. Remote left frontal lobe and posterior right temporal -parietal lobe infarcts. Mild global atrophy without hydrocephalus. No intracranial mass lesion noted on this unenhanced exam. Vascular: Hyperdense left middle cerebral artery M1  segment once again noted which may indicate presence of residual thrombus. Skull: No acute abnormality. Sinuses/Orbits: Post lens replacement without acute abnormality. Mild mucosal thickening posterior inferior left maxillary sinus. Minimal mucosal thickening ethmoid sinus air cells. The Other: Negative IMPRESSION: Hyperdense material seen within the superior aspect of the left major fissure. This may represent contrast or blood from recent intervention. Posterior aspect of the left lenticular nucleus was previously noted to be slightly hypodense which may indicate acute infarct. This area now demonstrates mild enhancement post angiography. Remote left frontal lobe and posterior right temporal -parietal lobe infarcts. Mild global atrophy without hydrocephalus. Hyperdense left middle cerebral artery M1 segment once again noted which may indicate presence of residual thrombus. These results will be called to the ordering clinician or representative by the Radiologist Assistant, and communication documented in the PACS or zVision Dashboard. Electronically Signed   By: Genia Del M.D.   On: 05/04/2016 13:03   Ct Angio Neck W Or Wo Contrast  Result Date: 05/04/2016 CLINICAL DATA:  Right-sided weakness.  Stroke. EXAM: CT ANGIOGRAPHY HEAD AND NECK TECHNIQUE: Multidetector CT imaging of the head and neck was performed using the standard protocol during bolus administration of intravenous contrast. Multiplanar CT image reconstructions and MIPs were obtained to evaluate the vascular anatomy. Carotid stenosis measurements (when applicable) are obtained utilizing NASCET criteria, using the distal internal carotid diameter as the denominator. CONTRAST:  50 mL Isovue 370 IV COMPARISON:  CT head 05/04/2016 FINDINGS: CTA NECK FINDINGS Aortic arch: Atherosclerotic calcification in the aortic arch. Negative for dissection or aneurysm. Proximal great vessels widely patent. Right carotid system: Right common carotid artery  widely patent. Mild atherosclerotic disease of the right carotid bifurcation without significant stenosis Left carotid system: Left common carotid artery widely patent. Mild atherosclerotic calcification at the carotid bifurcation. Less than 25% diameter stenosis left internal carotid artery. Vertebral arteries: Codominant vertebral arteries. No significant vertebral artery stenosis. Both vertebral arteries patent to the basilar without stenosis. Skeleton: Mild degenerative changes in the cervical spine. No acute skeletal abnormality. Other neck: 13 mm left thyroid nodule. No pathologic adenopathy in the neck. Upper chest: Lung apices clear. Review of the MIP images confirms the above findings CTA HEAD FINDINGS Anterior circulation: Mild atherosclerotic calcification in the cavernous carotid bilaterally causing mild stenosis. Right anterior and right middle cerebral arteries widely patent. Terminal left internal carotid artery widely patent. Left anterior cerebral artery widely patent. Occlusion of the left M1 segment with hypoperfusion throughout the left MCA territory. Posterior circulation: Both vertebral arteries contribute to the basilar. Basilar widely patent. PICA, AICA, superior cerebellar, and posterior cerebral arteries widely patent without stenosis. Venous sinuses: Suboptimal venous opacification due to timing. No evidence of venous sinus thrombosis. Anatomic variants: None Delayed phase: Not perform Review of the MIP images confirms the above findings  IMPRESSION: Occlusion left M1 segment with hypoperfusion throughout the left MCA territory. No other intracranial stenosis Mild atherosclerotic disease in the carotid bifurcation bilaterally without significant carotid stenosis. Electronically Signed   By: Franchot Gallo M.D.   On: 05/04/2016 10:31   Ct Cervical Spine Wo Contrast  Result Date: 05/04/2016 CLINICAL DATA:  Right side weakness. Right facial droop. Status post fall this morning. EXAM: CT  HEAD WITHOUT CONTRAST CT CERVICAL SPINE WITHOUT CONTRAST TECHNIQUE: Multidetector CT imaging of the head and cervical spine was performed following the standard protocol without intravenous contrast. Multiplanar CT image reconstructions of the cervical spine were also generated. COMPARISON:  None. FINDINGS: CT HEAD FINDINGS Brain: Remote infarcts in the posterior right MCA territory and deep white matter of the left frontal lobe are seen. No acute infarct, hemorrhage, mass lesion, midline shift or abnormal extra-axial fluid collection is identified. No hydrocephalus or pneumocephalus. Vascular: Atherosclerotic vascular disease is noted. Hyperattenuating focus in the distal left M1 segment is worrisome for thrombus. Skull: Intact. Sinuses/Orbits: The patient is status post bilateral lens extraction. Otherwise normal in appearance. Other: None. CT CERVICAL SPINE FINDINGS Alignment: Normal. Skull base and vertebrae: No acute fracture. No primary bone lesion or focal pathologic process. Soft tissues and spinal canal: No prevertebral fluid or swelling. No visible canal hematoma. Disc levels: Loss of disc space height with mild endplate spurring are seen at C6-7. Upper chest: Lung apices are clear. Other: Extensive atherosclerotic calcifications are noted. IMPRESSION: Hyperdense left MCA worrisome for thrombus. Lack of visualization of a parenchymal infarct may be due to hyperacuity. ASPECTS score is 10/10. Remote posterior right MCA territory infarcted deep white matter infarct in the left frontal lobe. Atherosclerosis. C6-7 degenerative disc disease. Critical Value/emergent results were called by telephone at the time of interpretation on 05/04/2016 at 10:14 am to Dr. Roland Rack , who verbally acknowledged these results. Electronically Signed   By: Inge Rise M.D.   On: 05/04/2016 10:14   Ct Head Code Stroke W/o Cm  Result Date: 05/04/2016 CLINICAL DATA:  Right side weakness. Right facial droop. Status  post fall this morning. EXAM: CT HEAD WITHOUT CONTRAST CT CERVICAL SPINE WITHOUT CONTRAST TECHNIQUE: Multidetector CT imaging of the head and cervical spine was performed following the standard protocol without intravenous contrast. Multiplanar CT image reconstructions of the cervical spine were also generated. COMPARISON:  None. FINDINGS: CT HEAD FINDINGS Brain: Remote infarcts in the posterior right MCA territory and deep white matter of the left frontal lobe are seen. No acute infarct, hemorrhage, mass lesion, midline shift or abnormal extra-axial fluid collection is identified. No hydrocephalus or pneumocephalus. Vascular: Atherosclerotic vascular disease is noted. Hyperattenuating focus in the distal left M1 segment is worrisome for thrombus. Skull: Intact. Sinuses/Orbits: The patient is status post bilateral lens extraction. Otherwise normal in appearance. Other: None. CT CERVICAL SPINE FINDINGS Alignment: Normal. Skull base and vertebrae: No acute fracture. No primary bone lesion or focal pathologic process. Soft tissues and spinal canal: No prevertebral fluid or swelling. No visible canal hematoma. Disc levels: Loss of disc space height with mild endplate spurring are seen at C6-7. Upper chest: Lung apices are clear. Other: Extensive atherosclerotic calcifications are noted. IMPRESSION: Hyperdense left MCA worrisome for thrombus. Lack of visualization of a parenchymal infarct may be due to hyperacuity. ASPECTS score is 10/10. Remote posterior right MCA territory infarcted deep white matter infarct in the left frontal lobe. Atherosclerosis. C6-7 degenerative disc disease. Critical Value/emergent results were called by telephone at the time of interpretation  on 05/04/2016 at 10:14 am to Dr. Roland Rack , who verbally acknowledged these results. Electronically Signed   By: Inge Rise M.D.   On: 05/04/2016 10:14     STUDIES:  Ct head 12/5>>>Hyperdense left MCA worrisome for thrombus  CT head  1239 pm 12/5>>>Hyperdense material seen within the superior aspect of the left major fissure. This may represent contrast or blood from recent intervention. Posterior aspect of the left lenticular nucleus was previously noted to be slightly hypodense which may indicate acute infarct. This area now demonstrates mild enhancement post angiography. Remote left frontal lobe and posterior right temporal -parietal lobe infarcts. Mild global atrophy without hydrocephalus. Hyperdense left middle cerebral artery M1 segment once again noted which may indicate presence of residual thrombus.   CULTURES:   ANTIBIOTICS:   SIGNIFICANT EVENTS: 12/5- IR , ETT  LINES/TUBES: 12/5 ETT>>>  ASSESSMENT / PLAN:  PULMONARY A: Acute resp failure, concern airway protection P:   Stat pcxr for ett ans infiltrate abg now Likely needs some reduction MV Wean aggressive in am   CARDIOVASCULAR A:  St changes?, r/o ischemia P:  Trop ecg Keep MAp per IR Off neo now   RENAL A:   At risk brain edema P:   Avoid free water Chem in am  Only saline Avoid d5 in fluids  GASTROINTESTINAL A:   Obese P:   pepcid Npo Feed in am if not extubated  HEMATOLOGIC A:   DVT prev P:  scd Cbc in am   INFECTIOUS A:   No asp noted P:   Keep temp 36-37  ENDOCRINE A:   Glu control  P:   cbg  NEUROLOGIC A:   Acute Left MCA CVA, sp IR P:   RASS goal: -1 Flat with sheath Per IR Neurology Control temps control glu   FAMILY  - Updates: no family in room   - Inter-disciplinary family meet or Palliative Care meeting due by:  12/12  Ccm time 30 min    Lavon Paganini. Titus Mould, MD, [place] Pgr: [facility]  Pulmonary and [place] Pager: [phone]  05/04/2016, 1:24 PM

## 2016-05-04 NOTE — Code Documentation (Signed)
75yo female arriving to [facility] via [place] at [phone].  Patient from home where she was LKW at [place] per patient's son.  Patient was in the bathroom getting ready for work when her son found her on the floor between 0600 and 0700.  EMS reports patient was at her baseline at 0600 per son who spoke with her at that time.  Patient on Xarelto for atrial fibrillation.  EMS assessed slurred speech and right sided weakness and activated a code stroke.  Stroke team at the bedside on patient arrival.  Labs drawn and patient cleared for CT by Dr. Tyrone Nine.  Patient to CT with team.  CT completed followed by CTA head and neck.  CTA showing LVO.  IR notified.  NIHSS 21, see documentation for details and code stroke times.  Patient with global aphasia and unintelligible speech, right hemiplegia, right hemianopia, left gaze and right neglect on exam.  Patient incontinent of stool and cleaned in CT.  Patient transferred directly to IR with team.  Bedside handoff with IR team.

## 2016-05-04 NOTE — H&P (Signed)
Neurology Consultation Reason for Consult: right sided weakness Referring Physician: Laneta Simmers, D  CC: Right sided weakness  History is obtained from:EMS  HPI: Diana Cervantes is a 76 y.o. female with a historyi of diabetes and hypertension who takes Xarelto for atrial fibrillation. She was in her normal state of health at 6 AM when the son talked to her this morning. She was getting ready for hair appointment when she fell in the bathroom. Her son found her on the ground, with severe right hemiparesis and aphasia. EMS was called and the patient was transported as a code stroke. She was taken for a CT angiogram which demonstrated a left MCA occlusion. She is not a candidate for TPA due to anticoagulation, but was taken for mechanical intervention.   LKW: 6am  tpa given?: no, xarelto Premorbid MRS: 0   ROS:  Unable to obtain due to altered mental status.   Past Medical History:  Diagnosis Date  . Cataract   . Diabetes mellitus without complication ([place])   . Hypertension      FHx: unable to obtain due to AMS   Social History:  reports that she quit smoking about 2 years ago. She uses smokeless tobacco. She reports that she does not drink alcohol or use drugs.   Exam: Current vital signs: Wt 73.6 kg (162 lb 4.1 oz)   BMI 29.68 kg/m  Vital signs in last 24 hours: Weight:  [73.4 kg (161 lb 13.1 oz)-73.6 kg (162 lb 4.1 oz)] 73.6 kg (162 lb 4.1 oz) (12/05 [id number])   Physical Exam  Constitutional: Appears well-developed and well-nourished.  Psych: Affect appropriate to situation Eyes: No scleral injection HENT: No OP obstrucion Head: Normocephalic.  Cardiovascular: Normal rate and regular rhythm.  Respiratory: Effort normal and breath sounds normal to anterior ascultation GI: Soft.  No distension. There is no tenderness.  Skin: WDI  Neuro: Mental Status: Patient is awake, she has word salad, and does not follow command Cranial Nerves: II: R hemianopia Pupils are equal,  round, and reactive to light.   III,IV, VI: L gaze preference V: Facial sensation is Decreased on the right VII: Facial movement With right weakness VIII: hearing is intact to voice X: Uvula elevates symmetrically Motor: She has a severe right hemiparesis, moves left side well and purposefully. Sensory: Sensation is decreased on the right Cerebellar: Does not perform   I have reviewed labs in epic and the results pertinent to this consultation are: Creatinine 0.7 Mild hypokalemia  I have reviewed the images obtained: CT angiogram-left MCA occlusion  Impression: 75 year old female with left MCA occlusion, she has within the 6 hour window for intra-arterial intervention. I discussed this with the interventional radiologist and she is being taken for IR.  Recommendations: 1. Neuro IR 2. MRI of the brain without contrast 3. Frequent neuro checks 4. Echocardiogram 5. SSI for DM 6. Prophylactic therapy-Antiplatelet med: Aspirin - dose 325mg  PO or 300mg  PR 7. Risk factor modification 8. Telemetry monitoring 9. PT consult, OT consult, Speech consult 10. HgbA1c, fasting lipid panel 11. Potassium for hypokalemia 12. please page stroke NP  Or  PA  Or MD  M-F from 8am -4 pm starting 12/6 as this patient will be followed by the stroke team at this point.   You can look them up on [email or web address]     This patient is critically ill and at significant risk of neurological worsening, death and care requires constant monitoring of vital signs, hemodynamics,respiratory and cardiac monitoring, neurological assessment, discussion  with family, other specialists and medical decision making of high complexity. I spent 50 minutes of neurocritical care time  in the care of  this patient.  Roland Rack, MD [facility] [phone]  If 7pm- 7am, please page neurology on call as listed in [place]. 05/04/2016  10:42 AM

## 2016-05-04 NOTE — Anesthesia Postprocedure Evaluation (Signed)
Anesthesia Post Note  Patient: Diana Cervantes  Procedure(s) Performed: Procedure(s) (LRB): RADIOLOGY WITH ANESTHESIA (N/A)  Patient location during evaluation: NICU Anesthesia Type: General Level of consciousness: patient remains intubated per anesthesia plan and sedated Pain management: pain level controlled Vital Signs Assessment: post-procedure vital signs reviewed and stable Respiratory status: patient on ventilator - see flowsheet for VS and patient remains intubated per anesthesia plan Cardiovascular status: blood pressure returned to baseline Anesthetic complications: no    Last Vitals:  Vitals:   05/04/16 1535 05/04/16 1600  BP: (!) 128/50 113/63  Pulse: 90 83  Resp: 14 19  Temp:  36.6 C    Last Pain:  Vitals:   05/04/16 1600  TempSrc: Axillary                 Annie Saephan COKER

## 2016-05-04 NOTE — Transfer of Care (Signed)
Immediate Anesthesia Transfer of Care Note  Patient: Diana Cervantes  Procedure(s) Performed: Procedure(s): RADIOLOGY WITH ANESTHESIA (N/A)  Patient Location: ICU  Anesthesia Type:General  Level of Consciousness: sedated and Patient remains intubated per anesthesia plan  Airway & Oxygen Therapy: Patient placed on Ventilator (see vital sign flow sheet for setting)  Post-op Assessment: Report given to RN and Post -op Vital signs reviewed and stable  Post vital signs: Reviewed and stable  Last Vitals: There were no vitals filed for this visit.  Last Pain: There were no vitals filed for this visit.       Complications: No apparent anesthesia complications   Transported from IR to CT scan to 3M14.  BP maintained between [id number] systolic throughout transfer from IR to CT to ICU.  Phenylephrine titrated off.  Hr 69-85 uncontrolled atrial fib.  Saturation 100% with bag mask ventilation performed from IR to CT scan and ventilator utilized with RT from CT to ICU.  Remained sedated throughout transfer.  Report to nurse at bedside in ICU.

## 2016-05-04 NOTE — ED Provider Notes (Signed)
[facility] DEPT Provider Note   CSN: [id number] Arrival date & time: 05/04/16  0932     History   Chief Complaint Chief Complaint  Patient presents with  . Code Stroke    HPI Diana Cervantes is a 75 y.o. female.  75 yo F with a chief complaint of right-sided weakness slurred speech. Patient has a history of being on stroke of her A. fib. Also in A. fib with RVR per EMS.  Level V caveat acuity of condition.    The history is provided by the patient and the EMS personnel.  Illness  This is a new problem. The current episode started 1 to 2 hours ago. The problem occurs constantly. The problem has not changed since onset.Pertinent negatives include no chest pain, no headaches and no shortness of breath. Nothing aggravates the symptoms. Nothing relieves the symptoms. She has tried nothing for the symptoms. The treatment provided no relief.    Past Medical History:  Diagnosis Date  . Cataract   . Diabetes mellitus without complication ([place])   . Hypertension     There are no active problems to display for this patient.   Past Surgical History:  Procedure Laterality Date  . ABDOMINAL HYSTERECTOMY    . CHOLECYSTECTOMY      OB History    No data available       Home Medications    Prior to Admission medications   Medication Sig Start Date End Date Taking? Authorizing Provider  amLODipine (NORVASC) 5 MG tablet Take 5 mg by mouth daily.    Historical Provider, MD  B Complex-C (SUPER B COMPLEX PO) Take 1 tablet by mouth daily.     Historical Provider, MD  benzonatate (TESSALON) 100 MG capsule Take 1 capsule (100 mg total) by mouth every 8 (eight) hours. 10/22/15   Gibson Ramp, MD  CALCIUM PO Take 1 tablet by mouth daily.     Historical Provider, MD  Cholecalciferol (VITAMIN D-3) 1000 units CAPS Take 1,000 Units by mouth daily.     Historical Provider, MD  linagliptin (TRADJENTA) 5 MG TABS tablet Take 5 mg by mouth daily.    Historical Provider, MD  Probiotic Product  (PROBIOTIC DAILY PO) Take 1 tablet by mouth daily.     Historical Provider, MD  rivaroxaban (XARELTO) 20 MG TABS tablet Take 1 tablet (20 mg total) by mouth daily with supper. 10/22/15   Gibson Ramp, MD  rosuvastatin (CRESTOR) 10 MG tablet Take 10 mg by mouth daily.    Historical Provider, MD  valsartan (DIOVAN) 40 MG tablet Take 40 mg by mouth daily.    Historical Provider, MD  valsartan-hydrochlorothiazide (DIOVAN-HCT) 160-25 MG tablet Take 1 tablet by mouth daily. 10/02/15   Historical Provider, MD    Family History No family history on file.  Social History Social History  Substance Use Topics  . Smoking status: Former Smoker    Quit date: 10/21/2013  . Smokeless tobacco: Current User     Comment: Quit 2 years ago - uses Secondary school teacher  . Alcohol use No     Allergies   Latex   Review of Systems Review of Systems  Unable to perform ROS: Acuity of condition  Constitutional: Negative for chills and fever.  HENT: Negative for congestion and rhinorrhea.   Eyes: Negative for redness and visual disturbance.  Respiratory: Negative for shortness of breath and wheezing.   Cardiovascular: Negative for chest pain and palpitations.  Gastrointestinal: Negative for nausea and vomiting.  Genitourinary:  Negative for dysuria and urgency.  Musculoskeletal: Negative for arthralgias and myalgias.  Skin: Negative for pallor and wound.  Neurological: Negative for dizziness and headaches.     Physical Exam Updated Vital Signs Wt 162 lb 4.1 oz (73.6 kg)   BMI 29.68 kg/m   Physical Exam  Constitutional: She is oriented to person, place, and time. She appears well-developed and well-nourished. No distress.  HENT:  Head: Normocephalic and atraumatic.  Eyes: EOM are normal. Pupils are equal, round, and reactive to light.  Neck: Normal range of motion. Neck supple.  Cardiovascular: Normal rate and regular rhythm.  Exam reveals no gallop and no friction rub.   No murmur heard. Pulmonary/Chest:  Effort normal. She has no wheezes. She has no rales.  Abdominal: Soft. She exhibits no distension. There is no tenderness.  Musculoskeletal: She exhibits no edema or tenderness.  Neurological: She is alert and oriented to person, place, and time.  Mild confusion, R sided weakness, R facial droop, slurred speech.   Skin: Skin is warm and dry. She is not diaphoretic.  Psychiatric: She has a normal mood and affect. Her behavior is normal.  Nursing note and vitals reviewed.    ED Treatments / Results  Labs (all labs ordered are listed, but only abnormal results are displayed) Labs Reviewed  CBC - Abnormal; Notable for the following:       Result Value   Hemoglobin 11.2 (*)    HCT 34.0 (*)    All other components within normal limits  DIFFERENTIAL - Abnormal; Notable for the following:    Neutro Abs 8.7 (*)    All other components within normal limits  CBG MONITORING, ED - Abnormal; Notable for the following:    Glucose-Capillary 175 (*)    All other components within normal limits  I-STAT CHEM 8, ED - Abnormal; Notable for the following:    Potassium 3.2 (*)    Chloride 99 (*)    Glucose, Bld 183 (*)    Calcium, Ion 0.92 (*)    Hemoglobin 11.6 (*)    HCT 34.0 (*)    All other components within normal limits  ETHANOL  PROTIME-INR  APTT  COMPREHENSIVE METABOLIC PANEL  RAPID URINE DRUG SCREEN, HOSP PERFORMED  URINALYSIS, ROUTINE W REFLEX MICROSCOPIC  I-STAT TROPOININ, ED    EKG  EKG Interpretation None       Radiology No results found.  Procedures Procedures (including critical care time)  Medications Ordered in ED Medications  iopamidol (ISOVUE-300) 61 % injection (not administered)  lidocaine (XYLOCAINE) 1 % (with pres) injection (not administered)  iopamidol (ISOVUE-370) 76 % injection 50 mL (50 mLs Intravenous Contrast Given 05/04/16 0946)     Initial Impression / Assessment and Plan / ED Course  I have reviewed the triage vital signs and the nursing  notes.  Pertinent labs & imaging results that were available during my care of the patient were reviewed by me and considered in my medical decision making (see chart for details).  Clinical Course     75 yo F With a chief complaints of right-sided weakness. Arrived as a code stroke. Taken urgently back to the CT scanner. Concern for an acute stroke will take directly to IR.  CRITICAL CARE Performed by: Cecilio Asper   Total critical care time: 35 minutes  Critical care time was exclusive of separately billable procedures and treating other patients.  Critical care was necessary to treat or prevent imminent or life-threatening deterioration.  Critical care was  time spent personally by me on the following activities: development of treatment plan with patient and/or surrogate as well as nursing, discussions with consultants, evaluation of patient's response to treatment, examination of patient, obtaining history from patient or surrogate, ordering and performing treatments and interventions, ordering and review of laboratory studies, ordering and review of radiographic studies, pulse oximetry and re-evaluation of patient's condition.  The patients results and plan were reviewed and discussed.   Any x-rays performed were independently reviewed by myself.   Differential diagnosis were considered with the presenting HPI.  Medications  iopamidol (ISOVUE-300) 61 % injection (not administered)  lidocaine (XYLOCAINE) 1 % (with pres) injection (not administered)  eptifibatide (INTEGRILIN) 20 MG/10ML injection (not administered)  iopamidol (ISOVUE-370) 76 % injection 50 mL (50 mLs Intravenous Contrast Given 05/04/16 0946)    Vitals:   05/04/16 0900 05/04/16 0948  Weight: 161 lb 13.1 oz (73.4 kg) 162 lb 4.1 oz (73.6 kg)    Final diagnoses:  Right sided weakness  Right sided weakness  Stroke [facility])    Admission/ observation were discussed with the admitting physician, patient  and/or family and they are comfortable with the plan.   Final Clinical Impressions(s) / ED Diagnoses   Final diagnoses:  Right sided weakness  Right sided weakness  Stroke [facility])    New Prescriptions New Prescriptions   No medications on file     Deno Etienne, DO 05/04/16 1006

## 2016-05-04 NOTE — ED Notes (Signed)
CBG 175; RN notified 

## 2016-05-04 NOTE — Procedures (Signed)
S/P 4 vessel cerebral arteriogram. RT CFA approach. Findings. 1Occluded LT MCA prox with  Complete revascularization with x 1 pass with the 34mm x 40 mm Solitaire FR retrieval device and 3.6 mg of superselective intracranial Integrelin achieving a TICI 3 reperfusion.

## 2016-05-04 NOTE — ED Triage Notes (Signed)
Patient presents today from home as Code Stroke. Patient was walking to bathroom at 0600 and per EMS sometime between 0600 and 0700 patient fell in bathroom and was found. Per EMS family states spoke with patient at 0600 and states patient speech clear/. Per EMS patient has  Right sided weakness, right facial droop, patient has decrease sensation, patient has slurred speech and is not following commands. Patient was ask name on arrival patient unable to state name. Speech slurred. Patient has c-coller in place.  Patient Currently in CT.

## 2016-05-04 NOTE — Progress Notes (Signed)
Pt transferred  to 3M14, received by Joelene Millin, RN; groin assessed with RN and report given. IR team signing off care.

## 2016-05-05 ENCOUNTER — Inpatient Hospital Stay (HOSPITAL_COMMUNITY): Payer: PPO

## 2016-05-05 ENCOUNTER — Encounter (HOSPITAL_COMMUNITY): Payer: Self-pay | Admitting: Interventional Radiology

## 2016-05-05 DIAGNOSIS — E1159 Type 2 diabetes mellitus with other circulatory complications: Secondary | ICD-10-CM | POA: Diagnosis not present

## 2016-05-05 DIAGNOSIS — I639 Cerebral infarction, unspecified: Secondary | ICD-10-CM | POA: Diagnosis not present

## 2016-05-05 DIAGNOSIS — Z9911 Dependence on respirator [ventilator] status: Secondary | ICD-10-CM | POA: Diagnosis not present

## 2016-05-05 DIAGNOSIS — I6300 Cerebral infarction due to thrombosis of unspecified precerebral artery: Secondary | ICD-10-CM | POA: Diagnosis not present

## 2016-05-05 DIAGNOSIS — I6789 Other cerebrovascular disease: Secondary | ICD-10-CM | POA: Diagnosis not present

## 2016-05-05 DIAGNOSIS — I480 Paroxysmal atrial fibrillation: Secondary | ICD-10-CM | POA: Diagnosis not present

## 2016-05-05 DIAGNOSIS — I638 Other cerebral infarction: Secondary | ICD-10-CM | POA: Diagnosis not present

## 2016-05-05 DIAGNOSIS — I63412 Cerebral infarction due to embolism of left middle cerebral artery: Secondary | ICD-10-CM | POA: Diagnosis not present

## 2016-05-05 DIAGNOSIS — I482 Chronic atrial fibrillation: Secondary | ICD-10-CM | POA: Diagnosis not present

## 2016-05-05 DIAGNOSIS — E785 Hyperlipidemia, unspecified: Secondary | ICD-10-CM | POA: Diagnosis not present

## 2016-05-05 DIAGNOSIS — R531 Weakness: Secondary | ICD-10-CM | POA: Diagnosis not present

## 2016-05-05 LAB — BASIC METABOLIC PANEL
ANION GAP: 10 (ref 5–15)
BUN: 7 mg/dL (ref 6–20)
CO2: 23 mmol/L (ref 22–32)
Calcium: 6.9 mg/dL — ABNORMAL LOW (ref 8.9–10.3)
Chloride: 112 mmol/L — ABNORMAL HIGH (ref 101–111)
Creatinine, Ser: 0.62 mg/dL (ref 0.44–1.00)
GFR calc Af Amer: >60 mL/min (ref >60)
GFR calc non Af Amer: >60 mL/min (ref >60)
GLUCOSE: 105 mg/dL — AB (ref 65–99)
POTASSIUM: 3.8 mmol/L (ref 3.5–5.1)
Sodium: 145 mmol/L (ref 135–145)

## 2016-05-05 LAB — LIPID PANEL
CHOLESTEROL: 77 mg/dL (ref 0–200)
HDL: 30 mg/dL — ABNORMAL LOW (ref >40)
LDL CALC: 32 mg/dL (ref 0–99)
Total CHOL/HDL Ratio: 2.6 RATIO
Triglycerides: 77 mg/dL (ref <150)
VLDL: 15 mg/dL (ref 0–40)

## 2016-05-05 LAB — CBC WITH DIFFERENTIAL/PLATELET
BASOS ABS: 0 10*3/uL (ref 0.0–0.1)
Basophils Relative: 0 %
EOS PCT: 1 %
Eosinophils Absolute: 0.1 10*3/uL (ref 0.0–0.7)
HEMATOCRIT: 31.2 % — AB (ref 36.0–46.0)
HEMOGLOBIN: 9.9 g/dL — AB (ref 12.0–15.0)
LYMPHS PCT: 13 %
Lymphs Abs: 1.1 10*3/uL (ref 0.7–4.0)
MCH: 26.2 pg (ref 26.0–34.0)
MCHC: 31.7 g/dL (ref 30.0–36.0)
MCV: 82.5 fL (ref 78.0–100.0)
Monocytes Absolute: 0.6 10*3/uL (ref 0.1–1.0)
Monocytes Relative: 7 %
NEUTROS ABS: 6.3 10*3/uL (ref 1.7–7.7)
NEUTROS PCT: 79 %
PLATELETS: 236 10*3/uL (ref 150–400)
RBC: 3.78 MIL/uL — AB (ref 3.87–5.11)
RDW: 14.4 % (ref 11.5–15.5)
WBC: 8 10*3/uL (ref 4.0–10.5)

## 2016-05-05 LAB — GLUCOSE, CAPILLARY
GLUCOSE-CAPILLARY: 110 mg/dL — AB (ref 65–99)
GLUCOSE-CAPILLARY: 123 mg/dL — AB (ref 65–99)
GLUCOSE-CAPILLARY: 88 mg/dL (ref 65–99)
Glucose-Capillary: 130 mg/dL — ABNORMAL HIGH (ref 65–99)
Glucose-Capillary: 127 mg/dL — ABNORMAL HIGH (ref 65–99)
Glucose-Capillary: 95 mg/dL (ref 65–99)

## 2016-05-05 LAB — BLOOD GAS, ARTERIAL
Acid-Base Excess: 1.7 mmol/L (ref 0.0–2.0)
BICARBONATE: 25.2 mmol/L (ref 20.0–28.0)
Drawn by: 406621
FIO2: 40
LHR: 14 {breaths}/min
O2 Saturation: 98.8 %
PATIENT TEMPERATURE: 98.6
PEEP/CPAP: 5 cmH2O
VT: 470 mL
pCO2 arterial: 35.6 mmHg (ref 32.0–48.0)
pH, Arterial: 7.464 — ABNORMAL HIGH (ref 7.350–7.450)
pO2, Arterial: 143 mmHg — ABNORMAL HIGH (ref 83.0–108.0)

## 2016-05-05 LAB — COMPREHENSIVE METABOLIC PANEL
ALT: 15 U/L (ref 14–54)
AST: 20 U/L (ref 15–41)
Albumin: 2.9 g/dL — ABNORMAL LOW (ref 3.5–5.0)
Alkaline Phosphatase: 61 U/L (ref 38–126)
Anion gap: 5 (ref 5–15)
CHLORIDE: 107 mmol/L (ref 101–111)
CO2: 30 mmol/L (ref 22–32)
CREATININE: 0.58 mg/dL (ref 0.44–1.00)
Calcium: 6.7 mg/dL — ABNORMAL LOW (ref 8.9–10.3)
GFR calc Af Amer: >60 mL/min (ref >60)
Glucose, Bld: 123 mg/dL — ABNORMAL HIGH (ref 65–99)
Potassium: 2.9 mmol/L — ABNORMAL LOW (ref 3.5–5.1)
Sodium: 142 mmol/L (ref 135–145)
Total Bilirubin: 0.5 mg/dL (ref 0.3–1.2)
Total Protein: 5.6 g/dL — ABNORMAL LOW (ref 6.5–8.1)

## 2016-05-05 LAB — ECHOCARDIOGRAM COMPLETE
AOASC: 32 cm
CHL CUP DOP CALC LVOT VTI: 16.4 cm
EERAT: 11.78
EWDT: 144 ms
FS: 22 % — AB (ref 28–44)
HEIGHTINCHES: 66 in
IVS/LV PW RATIO, ED: 1.01
LA ID, A-P, ES: 41 mm
LA diam end sys: 41 mm
LA vol A4C: 90.5 ml
LADIAMINDEX: 2.28 cm/m2
LAVOL: 65.5 mL
LAVOLIN: 36.4 mL/m2
LV PW d: 12.7 mm — AB (ref 0.6–1.1)
LV e' LATERAL: 10.1 cm/s
LVEEAVG: 11.78
LVEEMED: 11.78
LVOT area: 2.84 cm2
LVOT diameter: 19 mm
LVOT peak vel: 90.5 cm/s
LVOTSV: 47 mL
Lateral S' vel: 10.8 cm/s
MV Dec: 144
MV Peak grad: 6 mmHg
MVPKEVEL: 119 m/s
RV TAPSE: 22.4 mm
TDI e' lateral: 10.1
TDI e' medial: 7.51
WEIGHTICAEL: 2497.37 [oz_av]

## 2016-05-05 MED ORDER — ASPIRIN 300 MG RE SUPP
300.0000 mg | Freq: Every day | RECTAL | Status: DC
  Administered 2016-05-05: 300 mg via RECTAL
  Filled 2016-05-05: qty 1

## 2016-05-05 MED ORDER — POTASSIUM CHLORIDE 20 MEQ/15ML (10%) PO SOLN
40.0000 meq | Freq: Once | ORAL | Status: AC
Start: 2016-05-05 — End: 2016-05-05
  Administered 2016-05-05: 40 meq
  Filled 2016-05-05: qty 30

## 2016-05-05 MED ORDER — POTASSIUM CHLORIDE 20 MEQ/15ML (10%) PO SOLN
20.0000 meq | Freq: Once | ORAL | Status: AC
  Administered 2016-05-05: 20 meq
  Filled 2016-05-05: qty 15

## 2016-05-05 MED ORDER — ASPIRIN EC 325 MG PO TBEC: 325.0000 mg | DELAYED_RELEASE_TABLET | Freq: Every day | ORAL | Status: DC

## 2016-05-05 MED ORDER — PNEUMOCOCCAL VAC POLYVALENT 25 MCG/0.5ML IJ INJ: 0.5000 mL | INJECTION | INTRAMUSCULAR | Status: DC | PRN

## 2016-05-05 MED ORDER — SODIUM CHLORIDE 0.9 % IV SOLN
30.0000 meq | Freq: Once | INTRAVENOUS | Status: AC
  Administered 2016-05-05: 30 meq via INTRAVENOUS
  Filled 2016-05-05: qty 15

## 2016-05-05 NOTE — Progress Notes (Signed)
56f sheath removed exoseal applied no complications.  45f sheath removed v pad applied no complications.  Distal pulses present reviewed with rn heather

## 2016-05-05 NOTE — Procedures (Signed)
Extubation Procedure Note  Patient Details:   Name: Diana Cervantes DOB: March 15, 1941 MRN: [id number]   Airway Documentation:     Evaluation  O2 sats: stable throughout Complications: No apparent complications Patient did tolerate procedure well. Bilateral Breath Sounds: Clear, Diminished   Yes   Pt extubated to 3L [place] per MD order, with no complications. Pt able to speak her name with a weak non-productive cough. Clear/diminished BS noted throughout. RT will continue to monitor.   Jesse Sans 05/05/2016, 1:05 PM

## 2016-05-05 NOTE — Progress Notes (Signed)
STROKE TEAM PROGRESS NOTE   HISTORY OF PRESENT ILLNESS (per record) Diana Cervantes is a 75 y.o. female with a history of diabetes and hypertension who takes Xarelto for atrial fibrillation. She was in her normal state of health at 6 AM when the son talked to her this morning 04/04/2016 ([facility]KW). She was getting ready for hair appointment when she fell in the bathroom. Her son found her on the ground, with severe right hemiparesis and aphasia. EMS was called and the patient was transported as a code stroke. She was taken for a CT angiogram which demonstrated a left MCA occlusion. She is not a candidate for TPA due to anticoagulation, but was taken for mechanical intervention. Angio showed an occluded LT MCA. TICI 3 revascularization with Soltaire and Integrelin. She was admitted to the neuro ICU for further evaluation and treatment. Premorbid MRS: 0.   SUBJECTIVE (INTERVAL HISTORY) Her son and son-in-law are at the bedside.  She still intubated but following commands and right UE and LE started to move. BP on the low side, decrease propofol rate. Plan of extubation, sheath removal, and MRI.   OBJECTIVE Temp:  [97.8 F (36.6 C)-99.7 F (37.6 C)] 99.7 F (37.6 C) (12/06 1200) Pulse Rate:  [41-111] 111 (12/06 1300) Cardiac Rhythm: Atrial fibrillation (12/06 0800) Resp:  [0-27] 22 (12/06 1300) BP: (96-144)/(50-81) 144/81 (12/06 1300) SpO2:  [98 %-100 %] 100 % (12/06 1305) Arterial Line BP: (84-157)/(47-72) 126/64 (12/06 1000) FiO2 (%):  [30 %-40 %] 30 % (12/06 1118) Weight:  [156 lb 1.4 oz (70.8 kg)] 156 lb 1.4 oz (70.8 kg) (12/06 0400)  CBC:   Recent Labs Lab 05/04/16 1331 05/05/16 0545  WBC 6.9 8.0  NEUTROABS 5.5 6.3  HGB 10.0* 9.9*  HCT 30.4* 31.2*  MCV 80.6 82.5  PLT 241 [id number]    Basic Metabolic Panel:   Recent Labs Lab 05/04/16 1331 05/05/16 0545  NA 140 142  K 2.9* 2.9*  CL 107 107  CO2 27 30  GLUCOSE 128* 123*  BUN 6 <5*  CREATININE 0.52 0.58  CALCIUM 6.9* 6.7*     Lipid Panel:     Component Value Date/Time   CHOL 77 05/05/2016 0545   TRIG 77 05/05/2016 0545   HDL 30 (L) 05/05/2016 0545   CHOLHDL 2.6 05/05/2016 0545   VLDL 15 05/05/2016 0545   LDLCALC 32 05/05/2016 0545   HgbA1c: No results found for: HGBA1C Urine Drug Screen:     Component Value Date/Time   LABOPIA NONE DETECTED 05/04/2016 2230   COCAINSCRNUR NONE DETECTED 05/04/2016 2230   LABBENZ NONE DETECTED 05/04/2016 2230   AMPHETMU NONE DETECTED 05/04/2016 2230   THCU NONE DETECTED 05/04/2016 2230   LABBARB NONE DETECTED 05/04/2016 2230      IMAGING I have personally reviewed the radiological images below and agree with the radiology interpretations.  Ct Angio Head and neck W Or Wo Contrast 05/04/2016 Occlusion left M1 segment with hypoperfusion throughout the left MCA territory. No other intracranial stenosis Mild atherosclerotic disease in the carotid bifurcation bilaterally without significant carotid stenosis.    Ct Head Wo Contrast 05/04/2016 Hyperdense material seen within the superior aspect of the left major fissure. This may represent contrast or blood from recent intervention. Posterior aspect of the left lenticular nucleus was previously noted to be slightly hypodense which may indicate acute infarct. This area now demonstrates mild enhancement post angiography. Remote left frontal lobe and posterior right temporal -parietal lobe infarcts. Mild global atrophy without hydrocephalus. Hyperdense left  middle cerebral artery M1 segment once again noted which may indicate presence of residual thrombus.  Ct Cervical Spine Wo Contrast 05/04/2016 Hyperdense left MCA worrisome for thrombus. Lack of visualization of a parenchymal infarct may be due to hyperacuity. ASPECTS score is 10/10. Remote posterior right MCA territory infarcted deep white matter infarct in the left frontal lobe. Atherosclerosis. C6-7 degenerative disc disease.   Cerebral Angiogram 04/04/2016 1Occluded LT  MCA prox with Complete revascularization with x 1 pass with the 68mm x 40 mm Solitaire FR retrieval device and 3.6 mg of superselective intracranial Integrelin achieving a TICI 3 reperfusion  MRI and MRA pending  TTE pending   PHYSICAL EXAM  Temp:  [97.8 F (36.6 C)-99.7 F (37.6 C)] 99.7 F (37.6 C) (12/06 1200) Pulse Rate:  [41-111] 111 (12/06 1300) Resp:  [0-27] 22 (12/06 1300) BP: (96-144)/(50-81) 144/81 (12/06 1300) SpO2:  [98 %-100 %] 100 % (12/06 1305) Arterial Line BP: (84-157)/(47-72) 126/64 (12/06 1000) FiO2 (%):  [30 %-40 %] 30 % (12/06 1118) Weight:  [156 lb 1.4 oz (70.8 kg)] 156 lb 1.4 oz (70.8 kg) (12/06 0400)  General - Well nourished, well developed, intubated and on low dose propofol.  Ophthalmologic - Fundi not visualized due to ET tube distress.  Cardiovascular - irregularly irregular heart rate and rhythm.  Neuro - lethargic but open eyes on voice, intubated on low dose propofol. Able to follow all simple midline and peripheral commands. PERRL, EOMI, bilateral unsustained nystagmus. Blinking to visual threat bilaterally. Tongue in middle. Facial symmetry can not tested due to ET tube. LUE and LLE 4/5. RUE 0/5 proximal but 2+/5 distally. RLE 2+/5. DTR 1+ and no babinski. Sensation symmetrical. Not cooperative on coordination, gait not tested.   ASSESSMENT/PLAN Diana Cervantes is a 75 y.o. female with history of atrial fibrillation, found down with R hemiparesis and aphasia. She did not receive IV t-PA due to being on Xarelto. Found L MCA occlusion. Taken to IR where she received TICI 3 revascularization with Soltaire and Integrelin.    Stroke:  Dominant left MCA infarct, embolic secondary to known atrial fibrillation   Resultant  R hemiparesis  Code Stroke CT Hyperdense L MCA. Old right parietal and left CR infarct.  CTA head and neck occluded L M1 with L MCA territory hypoperfusion. Mild carotid disease w/o significant stenosis.   Cerebral angio  occluded L MCA. TICI 3 reperfusion w/ solitaire and integrelin  Post IR CT Hyperdense materior uperior aspect of L major fissure. Likely L LN infarct given post con angio enhancement. hyperdnese L MCA M1  MRI  pending  MRA pending  2D Echo  pending  LDL 32  HgbA1c pending  SCDs for VTE prophylaxis Diet NPO time specified  Xarelto (rivaroxaban) daily prior to admission, now on ASA suppository. Consider to resume DOACs if stroke volume is small.   Ongoing aggressive stroke risk factor management  Therapy recommendations:  pending   Disposition:  pending   Respiratory Failure  Intubated for neuro intervention  CCM consulting  Plan to extubate today  Atrial Fibrillation  Home anticoagulation:  Xarelto (rivaroxaban) daily continued in the hospital  As NPO, xarelto on hold  Now on ASA  Consider to resume DOACs if stroke volume is small.   Hypertension  Stable  BP management per IR x 24h  Long-term BP goal normotensive  Hyperlipidemia  Home meds:  crestor 10   resume crestor in hospital when able to swallow  LDL 32, goal < 70  Plan  continue statin at discharge  Diabetes  HgbA1c pending, goal < 7.0  Other Stroke Risk Factors  Advanced age  Uses smokeless tobacco  Other Active Problems  Elevated troponin 0.4, likely related to AF    Hospital day # 1  This patient is critically ill due to left MCA infarct due to left MCA occlusion s/p IR, afib on AC, respiratory failure and at significant risk of neurological worsening, death form recurrent stroke, hemorrhagic transformation, heart failure, brain herniation, shock. This patient's care requires constant monitoring of vital signs, hemodynamics, respiratory and cardiac monitoring, review of multiple databases, neurological assessment, discussion with family, other specialists and medical decision making of high complexity. I spent 40 minutes of neurocritical care time in the care of this  patient.  Rosalin Hawking, MD PhD Stroke Neurology 05/05/2016 2:30 PM   To contact Stroke Continuity provider, please refer to [email or web address] After hours, contact General Neurology

## 2016-05-05 NOTE — Progress Notes (Signed)
  Echocardiogram 2D Echocardiogram has been performed.  Kaelin Holford L Androw 05/05/2016, 4:58 PM

## 2016-05-05 NOTE — Progress Notes (Signed)
PULMONARY / CRITICAL CARE MEDICINE   Name: Diana Cervantes MRN: [id number] DOB: 1941-04-21    ADMISSION DATE:  05/04/2016 CONSULTATION DATE:  12/5/167  REFERRING MD:  Leonel Ramsay, MD   CHIEF COMPLAINT:  Acute resp failure, stroke  HISTORY OF PRESENT ILLNESS:   Diana Cervantes is a 75 y.o. female with a historyi of diabetes and hypertension who takes Xarelto for atrial fibrillation. She was in her normal state of health at 6 AM when the son talked to her this morning.  She fell in the bathroom. Her son found her on the ground, with severe right hemiparesis and aphasia. EMS was called and the patient was transported as a code stroke. She was taken for a CT angiogram which demonstrated a left MCA occlusion. She is not a candidate for TPA due to anticoagulation, but was taken for mechanical intervention. She had a successful left MCA revascularization procedure and remained on ventilation post op.  Sh eof course ws intubated for the procedure, Also just had a repeat Ct chead. She was on neo for a short period of time but arrived off.    SUBJECTIVE:  Post IR, vent, NAD. To remain on vent till sheaths out and MRA completed  VITAL SIGNS: BP (!) 135/55   Pulse 93   Temp 99.3 F (37.4 C) (Axillary)   Resp 17   Ht 5\' 6"  (1.676 m)   Wt 156 lb 1.4 oz (70.8 kg)   SpO2 100%   BMI 25.19 kg/m   HEMODYNAMICS:    VENTILATOR SETTINGS: Vent Mode: PRVC FiO2 (%):  [40 %-60 %] 40 % Set Rate:  [14 bmp] 14 bmp Vt Set:  [470 mL-580 mL] 470 mL PEEP:  [5 cmH20] 5 cmH20 Plateau Pressure:  [16 cmH20-18 cmH20] 18 cmH20  INTAKE / OUTPUT: I/O last 3 completed shifts: In: 2305.1 [I.V.:2305.1] Out: 2815 [Urine:2765; Blood:50]  PHYSICAL EXAMINATION: General:  Sedated on vent Neuro:  perr 2-3 mm HEENT:  jvd wnl, ett Cardiovascular:  s1 s2 RRR distant Lungs:  Coarse to clear  Abdomen:  Soft, obese, NT, nd, no r Musculoskeletal:  Min edema Skin:  No rash Bilateral fem sheaths    LABS:  BMET  Recent Labs Lab 05/04/16 0934 05/04/16 0940 05/04/16 1331 05/05/16 0545  NA 139 140 140 142  K 3.1* 3.2* 2.9* 2.9*  CL 103 99* 107 107  CO2 25  --  27 30  BUN 7 7 6  <5*  CREATININE 0.67 0.70 0.52 0.58  GLUCOSE 182* 183* 128* 123*    Electrolytes  Recent Labs Lab 05/04/16 0934 05/04/16 1331 05/05/16 0545  CALCIUM 7.8* 6.9* 6.7*    CBC  Recent Labs Lab 05/04/16 0934 05/04/16 0940 05/04/16 1331 05/05/16 0545  WBC 10.1  --  6.9 8.0  HGB 11.2* 11.6* 10.0* 9.9*  HCT 34.0* 34.0* 30.4* 31.2*  PLT 273  --  241 236    Coag's  Recent Labs Lab 05/04/16 0934  APTT 29  INR 1.05    Sepsis Markers No results for input(s): LATICACIDVEN, PROCALCITON, O2SATVEN in the last 168 hours.  ABG  Recent Labs Lab 05/04/16 1336 05/05/16 0742  PHART 7.487* 7.464*  PCO2ART 30.2* 35.6  PO2ART 188* 143*    Liver Enzymes  Recent Labs Lab 05/04/16 0934 05/05/16 0545  AST 27 20  ALT 19 15  ALKPHOS 63 61  BILITOT 0.6 0.5  ALBUMIN 3.6 2.9*    Cardiac Enzymes  Recent Labs Lab 05/04/16 1331  TROPONINI 0.04*  Glucose  Recent Labs Lab 05/04/16 1457 05/04/16 1543 05/04/16 1925 05/04/16 2313 05/05/16 0325 05/05/16 0742  GLUCAP 106* 115* 117* 108* 130* 127*    Imaging Ct Angio Head W Or Wo Contrast  Result Date: 05/04/2016 CLINICAL DATA:  Right-sided weakness.  Stroke. EXAM: CT ANGIOGRAPHY HEAD AND NECK TECHNIQUE: Multidetector CT imaging of the head and neck was performed using the standard protocol during bolus administration of intravenous contrast. Multiplanar CT image reconstructions and MIPs were obtained to evaluate the vascular anatomy. Carotid stenosis measurements (when applicable) are obtained utilizing NASCET criteria, using the distal internal carotid diameter as the denominator. CONTRAST:  50 mL Isovue 370 IV COMPARISON:  CT head 05/04/2016 FINDINGS: CTA NECK FINDINGS Aortic arch: Atherosclerotic calcification in the aortic  arch. Negative for dissection or aneurysm. Proximal great vessels widely patent. Right carotid system: Right common carotid artery widely patent. Mild atherosclerotic disease of the right carotid bifurcation without significant stenosis Left carotid system: Left common carotid artery widely patent. Mild atherosclerotic calcification at the carotid bifurcation. Less than 25% diameter stenosis left internal carotid artery. Vertebral arteries: Codominant vertebral arteries. No significant vertebral artery stenosis. Both vertebral arteries patent to the basilar without stenosis. Skeleton: Mild degenerative changes in the cervical spine. No acute skeletal abnormality. Other neck: 13 mm left thyroid nodule. No pathologic adenopathy in the neck. Upper chest: Lung apices clear. Review of the MIP images confirms the above findings CTA HEAD FINDINGS Anterior circulation: Mild atherosclerotic calcification in the cavernous carotid bilaterally causing mild stenosis. Right anterior and right middle cerebral arteries widely patent. Terminal left internal carotid artery widely patent. Left anterior cerebral artery widely patent. Occlusion of the left M1 segment with hypoperfusion throughout the left MCA territory. Posterior circulation: Both vertebral arteries contribute to the basilar. Basilar widely patent. PICA, AICA, superior cerebellar, and posterior cerebral arteries widely patent without stenosis. Venous sinuses: Suboptimal venous opacification due to timing. No evidence of venous sinus thrombosis. Anatomic variants: None Delayed phase: Not perform Review of the MIP images confirms the above findings IMPRESSION: Occlusion left M1 segment with hypoperfusion throughout the left MCA territory. No other intracranial stenosis Mild atherosclerotic disease in the carotid bifurcation bilaterally without significant carotid stenosis. Electronically Signed   By: Franchot Gallo M.D.   On: 05/04/2016 10:31   Ct Head Wo  Contrast  Result Date: 05/04/2016 CLINICAL DATA:  75 year old female with right-sided weakness post intervention for thrombus M1 segment. EXAM: CT HEAD WITHOUT CONTRAST TECHNIQUE: Contiguous axial images were obtained from the base of the skull through the vertex without intravenous contrast. COMPARISON:  Catheter angiogram, CT angiogram and head CT 05/04/2016. FINDINGS: Brain: Hyperdense material seen within the superior aspect of the left major fissure. This may represent contrast or blood from recent intervention. Posterior aspect of the left lenticular nucleus was previously noted to be slightly hypodense which may indicate acute infarct. This area now demonstrates mild enhancement post angiography. Remote left frontal lobe and posterior right temporal -parietal lobe infarcts. Mild global atrophy without hydrocephalus. No intracranial mass lesion noted on this unenhanced exam. Vascular: Hyperdense left middle cerebral artery M1 segment once again noted which may indicate presence of residual thrombus. Skull: No acute abnormality. Sinuses/Orbits: Post lens replacement without acute abnormality. Mild mucosal thickening posterior inferior left maxillary sinus. Minimal mucosal thickening ethmoid sinus air cells. The Other: Negative IMPRESSION: Hyperdense material seen within the superior aspect of the left major fissure. This may represent contrast or blood from recent intervention. Posterior aspect of the left lenticular nucleus  was previously noted to be slightly hypodense which may indicate acute infarct. This area now demonstrates mild enhancement post angiography. Remote left frontal lobe and posterior right temporal -parietal lobe infarcts. Mild global atrophy without hydrocephalus. Hyperdense left middle cerebral artery M1 segment once again noted which may indicate presence of residual thrombus. These results will be called to the ordering clinician or representative by the Radiologist Assistant, and  communication documented in the PACS or zVision Dashboard. Electronically Signed   By: Genia Del M.D.   On: 05/04/2016 13:03   Ct Angio Neck W Or Wo Contrast  Result Date: 05/04/2016 CLINICAL DATA:  Right-sided weakness.  Stroke. EXAM: CT ANGIOGRAPHY HEAD AND NECK TECHNIQUE: Multidetector CT imaging of the head and neck was performed using the standard protocol during bolus administration of intravenous contrast. Multiplanar CT image reconstructions and MIPs were obtained to evaluate the vascular anatomy. Carotid stenosis measurements (when applicable) are obtained utilizing NASCET criteria, using the distal internal carotid diameter as the denominator. CONTRAST:  50 mL Isovue 370 IV COMPARISON:  CT head 05/04/2016 FINDINGS: CTA NECK FINDINGS Aortic arch: Atherosclerotic calcification in the aortic arch. Negative for dissection or aneurysm. Proximal great vessels widely patent. Right carotid system: Right common carotid artery widely patent. Mild atherosclerotic disease of the right carotid bifurcation without significant stenosis Left carotid system: Left common carotid artery widely patent. Mild atherosclerotic calcification at the carotid bifurcation. Less than 25% diameter stenosis left internal carotid artery. Vertebral arteries: Codominant vertebral arteries. No significant vertebral artery stenosis. Both vertebral arteries patent to the basilar without stenosis. Skeleton: Mild degenerative changes in the cervical spine. No acute skeletal abnormality. Other neck: 13 mm left thyroid nodule. No pathologic adenopathy in the neck. Upper chest: Lung apices clear. Review of the MIP images confirms the above findings CTA HEAD FINDINGS Anterior circulation: Mild atherosclerotic calcification in the cavernous carotid bilaterally causing mild stenosis. Right anterior and right middle cerebral arteries widely patent. Terminal left internal carotid artery widely patent. Left anterior cerebral artery widely patent.  Occlusion of the left M1 segment with hypoperfusion throughout the left MCA territory. Posterior circulation: Both vertebral arteries contribute to the basilar. Basilar widely patent. PICA, AICA, superior cerebellar, and posterior cerebral arteries widely patent without stenosis. Venous sinuses: Suboptimal venous opacification due to timing. No evidence of venous sinus thrombosis. Anatomic variants: None Delayed phase: Not perform Review of the MIP images confirms the above findings IMPRESSION: Occlusion left M1 segment with hypoperfusion throughout the left MCA territory. No other intracranial stenosis Mild atherosclerotic disease in the carotid bifurcation bilaterally without significant carotid stenosis. Electronically Signed   By: Franchot Gallo M.D.   On: 05/04/2016 10:31   Ct Cervical Spine Wo Contrast  Result Date: 05/04/2016 CLINICAL DATA:  Right side weakness. Right facial droop. Status post fall this morning. EXAM: CT HEAD WITHOUT CONTRAST CT CERVICAL SPINE WITHOUT CONTRAST TECHNIQUE: Multidetector CT imaging of the head and cervical spine was performed following the standard protocol without intravenous contrast. Multiplanar CT image reconstructions of the cervical spine were also generated. COMPARISON:  None. FINDINGS: CT HEAD FINDINGS Brain: Remote infarcts in the posterior right MCA territory and deep white matter of the left frontal lobe are seen. No acute infarct, hemorrhage, mass lesion, midline shift or abnormal extra-axial fluid collection is identified. No hydrocephalus or pneumocephalus. Vascular: Atherosclerotic vascular disease is noted. Hyperattenuating focus in the distal left M1 segment is worrisome for thrombus. Skull: Intact. Sinuses/Orbits: The patient is status post bilateral lens extraction. Otherwise normal in appearance.  Other: None. CT CERVICAL SPINE FINDINGS Alignment: Normal. Skull base and vertebrae: No acute fracture. No primary bone lesion or focal pathologic process. Soft  tissues and spinal canal: No prevertebral fluid or swelling. No visible canal hematoma. Disc levels: Loss of disc space height with mild endplate spurring are seen at C6-7. Upper chest: Lung apices are clear. Other: Extensive atherosclerotic calcifications are noted. IMPRESSION: Hyperdense left MCA worrisome for thrombus. Lack of visualization of a parenchymal infarct may be due to hyperacuity. ASPECTS score is 10/10. Remote posterior right MCA territory infarcted deep white matter infarct in the left frontal lobe. Atherosclerosis. C6-7 degenerative disc disease. Critical Value/emergent results were called by telephone at the time of interpretation on 05/04/2016 at 10:14 am to Dr. Roland Rack , who verbally acknowledged these results. Electronically Signed   By: Inge Rise M.D.   On: 05/04/2016 10:14   Dg Chest Port 1 View  Result Date: 05/04/2016 CLINICAL DATA:  Stroke.  Endotracheal tube insertion. EXAM: PORTABLE CHEST 1 VIEW COMPARISON:  10/02/2015 FINDINGS: An endotracheal tube has been placed and terminates 2 cm above the carina. Enteric tube courses into the left upper abdomen with tip not imaged. The cardiac silhouette is upper limits of normal in size. Lung volumes are slightly diminished compared to the prior study with very mild pulmonary vascular congestion. No confluent airspace opacity, overt pulmonary edema, pleural effusion, or pneumothorax is identified. IMPRESSION: 1. Endotracheal tube 2 cm above the carina. 2. Minimal pulmonary vascular congestion. Electronically Signed   By: Logan Bores M.D.   On: 05/04/2016 13:43   Ct Head Code Stroke W/o Cm  Result Date: 05/04/2016 CLINICAL DATA:  Right side weakness. Right facial droop. Status post fall this morning. EXAM: CT HEAD WITHOUT CONTRAST CT CERVICAL SPINE WITHOUT CONTRAST TECHNIQUE: Multidetector CT imaging of the head and cervical spine was performed following the standard protocol without intravenous contrast. Multiplanar CT  image reconstructions of the cervical spine were also generated. COMPARISON:  None. FINDINGS: CT HEAD FINDINGS Brain: Remote infarcts in the posterior right MCA territory and deep white matter of the left frontal lobe are seen. No acute infarct, hemorrhage, mass lesion, midline shift or abnormal extra-axial fluid collection is identified. No hydrocephalus or pneumocephalus. Vascular: Atherosclerotic vascular disease is noted. Hyperattenuating focus in the distal left M1 segment is worrisome for thrombus. Skull: Intact. Sinuses/Orbits: The patient is status post bilateral lens extraction. Otherwise normal in appearance. Other: None. CT CERVICAL SPINE FINDINGS Alignment: Normal. Skull base and vertebrae: No acute fracture. No primary bone lesion or focal pathologic process. Soft tissues and spinal canal: No prevertebral fluid or swelling. No visible canal hematoma. Disc levels: Loss of disc space height with mild endplate spurring are seen at C6-7. Upper chest: Lung apices are clear. Other: Extensive atherosclerotic calcifications are noted. IMPRESSION: Hyperdense left MCA worrisome for thrombus. Lack of visualization of a parenchymal infarct may be due to hyperacuity. ASPECTS score is 10/10. Remote posterior right MCA territory infarcted deep white matter infarct in the left frontal lobe. Atherosclerosis. C6-7 degenerative disc disease. Critical Value/emergent results were called by telephone at the time of interpretation on 05/04/2016 at 10:14 am to Dr. Roland Rack , who verbally acknowledged these results. Electronically Signed   By: Inge Rise M.D.   On: 05/04/2016 10:14     STUDIES:  Ct head 12/5>>>Hyperdense left MCA worrisome for thrombus  CT head 1239 pm 12/5>>>Hyperdense material seen within the superior aspect of the left major fissure. This may represent contrast or blood  from recent intervention. Posterior aspect of the left lenticular nucleus was previously noted to be slightly  hypodense which may indicate acute infarct. This area now demonstrates mild enhancement post angiography. Remote left frontal lobe and posterior right temporal -parietal lobe infarcts. Mild global atrophy without hydrocephalus. Hyperdense left middle cerebral artery M1 segment once again noted which may indicate presence of residual thrombus.   CULTURES:   ANTIBIOTICS:   SIGNIFICANT EVENTS: 12/5- IR , ETT  LINES/TUBES: 12/5 ETT>>> 12/5 r&l femoral sheath>>  ASSESSMENT / PLAN:  PULMONARY A: Acute resp failure, concern airway protection P:   Stat pcxr for ett ans infiltrate abg now Likely needs some reduction MV Wean after MRA and sheaths removed  CARDIOVASCULAR A:  St changes?, r/o ischemia CAF on Xarelto P:  Trop .004 ecg with a fib Keep MAp per IR ? When restart anticogulation  RENAL Lab Results  Component Value Date   CREATININE 0.58 05/05/2016   CREATININE 0.52 05/04/2016   CREATININE 0.70 05/04/2016    Recent Labs Lab 05/04/16 0940 05/04/16 1331 05/05/16 0545  NA 140 140 142    Recent Labs Lab 05/04/16 0940 05/04/16 1331 05/05/16 0545  K 3.2* 2.9* 2.9*     A:   At risk brain edema P:   Avoid free water Chem and replete lytes as needed Only saline Avoid d5 in fluids  GASTROINTESTINAL A:   Obese P:   pepcid Npo Feed in am if not extubated  HEMATOLOGIC A:   DVT prev P:  scd Cbc in am   INFECTIOUS A:   No asp noted P:   Keep temp 36-37  ENDOCRINE CBG (last 3)   Recent Labs  05/04/16 2313 05/05/16 0325 05/05/16 0742  GLUCAP 108* 130* 127*     A:   Glu control  P:   cbg  NEUROLOGIC A:   Acute Left MCA CVA, sp IR P:   RASS goal: -1 Flat with sheath Per IR Neurology Control temps control glu To go for MRI 12/6   FAMILY  - Updates: no family in room   - Inter-disciplinary family meet or Palliative Care meeting due by:  12/12  Ccm time 30 min    Richardson Landry Minor ACNP Maryanna Shape PCCM Pager  [phone] till 3 pm If no answer page [phone] 05/05/2016, 8:18 AM  STAFF NOTE: I, Merrie Roof, MD FACP have personally reviewed patient's available data, including medical history, events of note, physical examination and test results as part of my evaluation. I have discussed with resident/NP and other care providers such as pharmacist, RN and RRT. In addition, I personally evaluated patient and elicited key findings of: awakens, fc, moves ext, some weakness rt, neuro improved, lungs clear, no edema onp cxr, wean aggressive cpap 5 ps5, goal 30 min , assess extubation post sheeth removal as goal soon, even balnce goals remain, trop neg,no ischemic noted, hypok, need more k supp, avoid free water, asessing cough, gag, , if back to vent reduce T Vby 1 cc/kg, I update sons The patient is critically ill with multiple organ systems failure and requires high complexity decision making for assessment and support, frequent evaluation and titration of therapies, application of advanced monitoring technologies and extensive interpretation of multiple databases.   Critical Care Time devoted to patient care services described in this note is 30 Minutes. This time reflects time of care of this signee: Merrie Roof, MD FACP. This critical care time does not reflect procedure time, or teaching time or supervisory time  of PA/NP/Med student/Med Resident etc but could involve care discussion time. Rest per NP/medical resident whose note is outlined above and that I agree with   Lavon Paganini. Titus Mould, MD, [place] Pgr: [facility] 05/05/2016 10:38 AM

## 2016-05-05 NOTE — Progress Notes (Signed)
Referring Physician(s): Dr Lavera Guise  Supervising Physician: Luanne Bras  Patient Status:  [facility] - In-pt  Chief Complaint:  CVA  Findings. 1Occluded LT MCA prox with  Complete revascularization with x 1 pass with the 16mm x 40 mm Solitaire FR retrieval device and 3.6 mg of superselective intracranial Integrelin achieving a TICI 3 reperfusion.  Subjective:  On vent Post L MCA revasc in IR 12/5 Does try to communicate Moving all 4s to command   Allergies: Latex and Xarelto [rivaroxaban]  Medications: Prior to Admission medications   Medication Sig Start Date End Date Taking? Authorizing Provider  B Complex-C (SUPER B COMPLEX PO) Take 1 tablet by mouth daily.    Yes Historical Provider, MD  CALCIUM PO Take 1 tablet by mouth daily.    Yes Historical Provider, MD  Cholecalciferol (VITAMIN D-3) 1000 units CAPS Take 1,000 Units by mouth daily.    Yes Historical Provider, MD  linagliptin (TRADJENTA) 5 MG TABS tablet Take 5 mg by mouth daily.   Yes Historical Provider, MD  Probiotic Product (PROBIOTIC DAILY PO) Take 1 tablet by mouth daily.    Yes Historical Provider, MD  rosuvastatin (CRESTOR) 10 MG tablet Take 10 mg by mouth daily.   Yes Historical Provider, MD  valsartan-hydrochlorothiazide (DIOVAN-HCT) 160-25 MG tablet Take 1 tablet by mouth daily. 10/02/15  Yes Historical Provider, MD  amLODipine (NORVASC) 5 MG tablet Take 5 mg by mouth daily.    Historical Provider, MD  benzonatate (TESSALON) 100 MG capsule Take 1 capsule (100 mg total) by mouth every 8 (eight) hours. Patient not taking: Reported on 05/04/2016 10/22/15   Gibson Ramp, MD  rivaroxaban (XARELTO) 20 MG TABS tablet Take 1 tablet (20 mg total) by mouth daily with supper. Patient not taking: Reported on 05/04/2016 10/22/15   Gibson Ramp, MD     Vital Signs: BP (!) 120/55   Pulse 86   Temp 99.5 F (37.5 C) (Axillary)   Resp (!) 25   Ht 5\' 6"  (1.676 m)   Wt 156 lb 1.4 oz (70.8 kg)   SpO2 100%   BMI 25.19 kg/m     Physical Exam  Abdominal: Soft. Bowel sounds are normal.  Musculoskeletal:  Moving all 4s to command  Neurological:  Follows me with eyes Acknowledges me when spoken to [facility] and closes eyes to command Tongue midline   Skin: Skin is warm.  B groin sites clean and dry Sheaths intact NT no bleeding Soft; no hematoma B feet 1+ pulses  Nursing note and vitals reviewed.   Imaging: Ct Angio Head W Or Wo Contrast  Result Date: 05/04/2016 CLINICAL DATA:  Right-sided weakness.  Stroke. EXAM: CT ANGIOGRAPHY HEAD AND NECK TECHNIQUE: Multidetector CT imaging of the head and neck was performed using the standard protocol during bolus administration of intravenous contrast. Multiplanar CT image reconstructions and MIPs were obtained to evaluate the vascular anatomy. Carotid stenosis measurements (when applicable) are obtained utilizing NASCET criteria, using the distal internal carotid diameter as the denominator. CONTRAST:  50 mL Isovue 370 IV COMPARISON:  CT head 05/04/2016 FINDINGS: CTA NECK FINDINGS Aortic arch: Atherosclerotic calcification in the aortic arch. Negative for dissection or aneurysm. Proximal great vessels widely patent. Right carotid system: Right common carotid artery widely patent. Mild atherosclerotic disease of the right carotid bifurcation without significant stenosis Left carotid system: Left common carotid artery widely patent. Mild atherosclerotic calcification at the carotid bifurcation. Less than 25% diameter stenosis left internal carotid artery. Vertebral arteries: Codominant  vertebral arteries. No significant vertebral artery stenosis. Both vertebral arteries patent to the basilar without stenosis. Skeleton: Mild degenerative changes in the cervical spine. No acute skeletal abnormality. Other neck: 13 mm left thyroid nodule. No pathologic adenopathy in the neck. Upper chest: Lung apices clear. Review of the MIP images confirms the above findings CTA HEAD FINDINGS Anterior  circulation: Mild atherosclerotic calcification in the cavernous carotid bilaterally causing mild stenosis. Right anterior and right middle cerebral arteries widely patent. Terminal left internal carotid artery widely patent. Left anterior cerebral artery widely patent. Occlusion of the left M1 segment with hypoperfusion throughout the left MCA territory. Posterior circulation: Both vertebral arteries contribute to the basilar. Basilar widely patent. PICA, AICA, superior cerebellar, and posterior cerebral arteries widely patent without stenosis. Venous sinuses: Suboptimal venous opacification due to timing. No evidence of venous sinus thrombosis. Anatomic variants: None Delayed phase: Not perform Review of the MIP images confirms the above findings IMPRESSION: Occlusion left M1 segment with hypoperfusion throughout the left MCA territory. No other intracranial stenosis Mild atherosclerotic disease in the carotid bifurcation bilaterally without significant carotid stenosis. Electronically Signed   By: Franchot Gallo M.D.   On: 05/04/2016 10:31   Ct Head Wo Contrast  Result Date: 05/04/2016 CLINICAL DATA:  75 year old female with right-sided weakness post intervention for thrombus M1 segment. EXAM: CT HEAD WITHOUT CONTRAST TECHNIQUE: Contiguous axial images were obtained from the base of the skull through the vertex without intravenous contrast. COMPARISON:  Catheter angiogram, CT angiogram and head CT 05/04/2016. FINDINGS: Brain: Hyperdense material seen within the superior aspect of the left major fissure. This may represent contrast or blood from recent intervention. Posterior aspect of the left lenticular nucleus was previously noted to be slightly hypodense which may indicate acute infarct. This area now demonstrates mild enhancement post angiography. Remote left frontal lobe and posterior right temporal -parietal lobe infarcts. Mild global atrophy without hydrocephalus. No intracranial mass lesion noted on  this unenhanced exam. Vascular: Hyperdense left middle cerebral artery M1 segment once again noted which may indicate presence of residual thrombus. Skull: No acute abnormality. Sinuses/Orbits: Post lens replacement without acute abnormality. Mild mucosal thickening posterior inferior left maxillary sinus. Minimal mucosal thickening ethmoid sinus air cells. The Other: Negative IMPRESSION: Hyperdense material seen within the superior aspect of the left major fissure. This may represent contrast or blood from recent intervention. Posterior aspect of the left lenticular nucleus was previously noted to be slightly hypodense which may indicate acute infarct. This area now demonstrates mild enhancement post angiography. Remote left frontal lobe and posterior right temporal -parietal lobe infarcts. Mild global atrophy without hydrocephalus. Hyperdense left middle cerebral artery M1 segment once again noted which may indicate presence of residual thrombus. These results will be called to the ordering clinician or representative by the Radiologist Assistant, and communication documented in the PACS or zVision Dashboard. Electronically Signed   By: Genia Del M.D.   On: 05/04/2016 13:03   Ct Angio Neck W Or Wo Contrast  Result Date: 05/04/2016 CLINICAL DATA:  Right-sided weakness.  Stroke. EXAM: CT ANGIOGRAPHY HEAD AND NECK TECHNIQUE: Multidetector CT imaging of the head and neck was performed using the standard protocol during bolus administration of intravenous contrast. Multiplanar CT image reconstructions and MIPs were obtained to evaluate the vascular anatomy. Carotid stenosis measurements (when applicable) are obtained utilizing NASCET criteria, using the distal internal carotid diameter as the denominator. CONTRAST:  50 mL Isovue 370 IV COMPARISON:  CT head 05/04/2016 FINDINGS: CTA NECK FINDINGS Aortic arch:  Atherosclerotic calcification in the aortic arch. Negative for dissection or aneurysm. Proximal great  vessels widely patent. Right carotid system: Right common carotid artery widely patent. Mild atherosclerotic disease of the right carotid bifurcation without significant stenosis Left carotid system: Left common carotid artery widely patent. Mild atherosclerotic calcification at the carotid bifurcation. Less than 25% diameter stenosis left internal carotid artery. Vertebral arteries: Codominant vertebral arteries. No significant vertebral artery stenosis. Both vertebral arteries patent to the basilar without stenosis. Skeleton: Mild degenerative changes in the cervical spine. No acute skeletal abnormality. Other neck: 13 mm left thyroid nodule. No pathologic adenopathy in the neck. Upper chest: Lung apices clear. Review of the MIP images confirms the above findings CTA HEAD FINDINGS Anterior circulation: Mild atherosclerotic calcification in the cavernous carotid bilaterally causing mild stenosis. Right anterior and right middle cerebral arteries widely patent. Terminal left internal carotid artery widely patent. Left anterior cerebral artery widely patent. Occlusion of the left M1 segment with hypoperfusion throughout the left MCA territory. Posterior circulation: Both vertebral arteries contribute to the basilar. Basilar widely patent. PICA, AICA, superior cerebellar, and posterior cerebral arteries widely patent without stenosis. Venous sinuses: Suboptimal venous opacification due to timing. No evidence of venous sinus thrombosis. Anatomic variants: None Delayed phase: Not perform Review of the MIP images confirms the above findings IMPRESSION: Occlusion left M1 segment with hypoperfusion throughout the left MCA territory. No other intracranial stenosis Mild atherosclerotic disease in the carotid bifurcation bilaterally without significant carotid stenosis. Electronically Signed   By: Franchot Gallo M.D.   On: 05/04/2016 10:31   Ct Cervical Spine Wo Contrast  Result Date: 05/04/2016 CLINICAL DATA:  Right side  weakness. Right facial droop. Status post fall this morning. EXAM: CT HEAD WITHOUT CONTRAST CT CERVICAL SPINE WITHOUT CONTRAST TECHNIQUE: Multidetector CT imaging of the head and cervical spine was performed following the standard protocol without intravenous contrast. Multiplanar CT image reconstructions of the cervical spine were also generated. COMPARISON:  None. FINDINGS: CT HEAD FINDINGS Brain: Remote infarcts in the posterior right MCA territory and deep white matter of the left frontal lobe are seen. No acute infarct, hemorrhage, mass lesion, midline shift or abnormal extra-axial fluid collection is identified. No hydrocephalus or pneumocephalus. Vascular: Atherosclerotic vascular disease is noted. Hyperattenuating focus in the distal left M1 segment is worrisome for thrombus. Skull: Intact. Sinuses/Orbits: The patient is status post bilateral lens extraction. Otherwise normal in appearance. Other: None. CT CERVICAL SPINE FINDINGS Alignment: Normal. Skull base and vertebrae: No acute fracture. No primary bone lesion or focal pathologic process. Soft tissues and spinal canal: No prevertebral fluid or swelling. No visible canal hematoma. Disc levels: Loss of disc space height with mild endplate spurring are seen at C6-7. Upper chest: Lung apices are clear. Other: Extensive atherosclerotic calcifications are noted. IMPRESSION: Hyperdense left MCA worrisome for thrombus. Lack of visualization of a parenchymal infarct may be due to hyperacuity. ASPECTS score is 10/10. Remote posterior right MCA territory infarcted deep white matter infarct in the left frontal lobe. Atherosclerosis. C6-7 degenerative disc disease. Critical Value/emergent results were called by telephone at the time of interpretation on 05/04/2016 at 10:14 am to Dr. Roland Rack , who verbally acknowledged these results. Electronically Signed   By: Inge Rise M.D.   On: 05/04/2016 10:14   Dg Chest Port 1 View  Result Date:  05/05/2016 CLINICAL DATA:  vent , post stroke EXAM: PORTABLE CHEST - 1 VIEW COMPARISON:  the previous day's study FINDINGS: Endotracheal tube and nasogastric tube stable in position.  Lungs clear. 2 metallic densities project over the right lung base as before. Heart size and mediastinal contours are within normal limits. No effusion. Visualized bones unremarkable. IMPRESSION: 1. No acute disease. 2.  Support hardware stable in position. Electronically Signed   By: Lucrezia Europe M.D.   On: 05/05/2016 08:19   Dg Chest Port 1 View  Result Date: 05/04/2016 CLINICAL DATA:  Stroke.  Endotracheal tube insertion. EXAM: PORTABLE CHEST 1 VIEW COMPARISON:  10/02/2015 FINDINGS: An endotracheal tube has been placed and terminates 2 cm above the carina. Enteric tube courses into the left upper abdomen with tip not imaged. The cardiac silhouette is upper limits of normal in size. Lung volumes are slightly diminished compared to the prior study with very mild pulmonary vascular congestion. No confluent airspace opacity, overt pulmonary edema, pleural effusion, or pneumothorax is identified. IMPRESSION: 1. Endotracheal tube 2 cm above the carina. 2. Minimal pulmonary vascular congestion. Electronically Signed   By: Logan Bores M.D.   On: 05/04/2016 13:43   Ct Head Code Stroke W/o Cm  Result Date: 05/04/2016 CLINICAL DATA:  Right side weakness. Right facial droop. Status post fall this morning. EXAM: CT HEAD WITHOUT CONTRAST CT CERVICAL SPINE WITHOUT CONTRAST TECHNIQUE: Multidetector CT imaging of the head and cervical spine was performed following the standard protocol without intravenous contrast. Multiplanar CT image reconstructions of the cervical spine were also generated. COMPARISON:  None. FINDINGS: CT HEAD FINDINGS Brain: Remote infarcts in the posterior right MCA territory and deep white matter of the left frontal lobe are seen. No acute infarct, hemorrhage, mass lesion, midline shift or abnormal extra-axial fluid  collection is identified. No hydrocephalus or pneumocephalus. Vascular: Atherosclerotic vascular disease is noted. Hyperattenuating focus in the distal left M1 segment is worrisome for thrombus. Skull: Intact. Sinuses/Orbits: The patient is status post bilateral lens extraction. Otherwise normal in appearance. Other: None. CT CERVICAL SPINE FINDINGS Alignment: Normal. Skull base and vertebrae: No acute fracture. No primary bone lesion or focal pathologic process. Soft tissues and spinal canal: No prevertebral fluid or swelling. No visible canal hematoma. Disc levels: Loss of disc space height with mild endplate spurring are seen at C6-7. Upper chest: Lung apices are clear. Other: Extensive atherosclerotic calcifications are noted. IMPRESSION: Hyperdense left MCA worrisome for thrombus. Lack of visualization of a parenchymal infarct may be due to hyperacuity. ASPECTS score is 10/10. Remote posterior right MCA territory infarcted deep white matter infarct in the left frontal lobe. Atherosclerosis. C6-7 degenerative disc disease. Critical Value/emergent results were called by telephone at the time of interpretation on 05/04/2016 at 10:14 am to Dr. Roland Rack , who verbally acknowledged these results. Electronically Signed   By: Inge Rise M.D.   On: 05/04/2016 10:14    Labs:  CBC:  Recent Labs  10/22/15 1928 05/04/16 0934 05/04/16 0940 05/04/16 1331 05/05/16 0545  WBC 6.6 10.1  --  6.9 8.0  HGB 11.8* 11.2* 11.6* 10.0* 9.9*  HCT 37.3 34.0* 34.0* 30.4* 31.2*  PLT 234 273  --  241 236    COAGS:  Recent Labs  05/04/16 0934  INR 1.05  APTT 29    BMP:  Recent Labs  10/22/15 1928 05/04/16 0934 05/04/16 0940 05/04/16 1331 05/05/16 0545  NA 139 139 140 140 142  K 3.3* 3.1* 3.2* 2.9* 2.9*  CL 102 103 99* 107 107  CO2 27 25  --  27 30  GLUCOSE 144* 182* 183* 128* 123*  BUN 6 7 7 6  <5*  CALCIUM 7.8*  7.8*  --  6.9* 6.7*  CREATININE 0.72 0.67 0.70 0.52 0.58  GFRNONAA >60  >60  --  >60 >60  GFRAA >60 >60  --  >60 >60    LIVER FUNCTION TESTS:  Recent Labs  05/04/16 0934 05/05/16 0545  BILITOT 0.6 0.5  AST 27 20  ALT 19 15  ALKPHOS 63 61  PROT 7.0 5.6*  ALBUMIN 3.6 2.9*    Assessment and Plan:  CVA L MCA Revasc in IR 12/5 On vent Plan for sheath removals; extubation today per notes Plan for MRI/MRA Will follow Plan per Dr Erlinda Hong   Electronically Signed: Monia Sabal A 05/05/2016, 9:48 AM   I spent a total of 15 Minutes at the the patient's bedside AND on the patient's hospital floor or unit, greater than 50% of which was counseling/coordinating care for CVA; LMCA revasc

## 2016-05-06 ENCOUNTER — Inpatient Hospital Stay (HOSPITAL_COMMUNITY): Payer: PPO

## 2016-05-06 ENCOUNTER — Encounter (HOSPITAL_COMMUNITY): Payer: Self-pay | Admitting: Interventional Radiology

## 2016-05-06 DIAGNOSIS — I638 Other cerebral infarction: Secondary | ICD-10-CM

## 2016-05-06 DIAGNOSIS — I6300 Cerebral infarction due to thrombosis of unspecified precerebral artery: Secondary | ICD-10-CM | POA: Diagnosis not present

## 2016-05-06 DIAGNOSIS — Z4682 Encounter for fitting and adjustment of non-vascular catheter: Secondary | ICD-10-CM | POA: Diagnosis not present

## 2016-05-06 DIAGNOSIS — I480 Paroxysmal atrial fibrillation: Secondary | ICD-10-CM | POA: Diagnosis not present

## 2016-05-06 DIAGNOSIS — E1159 Type 2 diabetes mellitus with other circulatory complications: Secondary | ICD-10-CM | POA: Diagnosis not present

## 2016-05-06 DIAGNOSIS — I482 Chronic atrial fibrillation: Secondary | ICD-10-CM | POA: Diagnosis not present

## 2016-05-06 DIAGNOSIS — E785 Hyperlipidemia, unspecified: Secondary | ICD-10-CM | POA: Diagnosis not present

## 2016-05-06 DIAGNOSIS — I63412 Cerebral infarction due to embolism of left middle cerebral artery: Secondary | ICD-10-CM | POA: Diagnosis not present

## 2016-05-06 DIAGNOSIS — R531 Weakness: Secondary | ICD-10-CM | POA: Diagnosis not present

## 2016-05-06 LAB — BASIC METABOLIC PANEL
ANION GAP: 10 (ref 5–15)
BUN: 8 mg/dL (ref 6–20)
CO2: 23 mmol/L (ref 22–32)
CREATININE: 0.64 mg/dL (ref 0.44–1.00)
Calcium: 6.7 mg/dL — ABNORMAL LOW (ref 8.9–10.3)
Chloride: 111 mmol/L (ref 101–111)
GFR calc Af Amer: >60 mL/min (ref >60)
GFR calc non Af Amer: >60 mL/min (ref >60)
GLUCOSE: 124 mg/dL — AB (ref 65–99)
POTASSIUM: 3.6 mmol/L (ref 3.5–5.1)
SODIUM: 144 mmol/L (ref 135–145)

## 2016-05-06 LAB — GLUCOSE, CAPILLARY
GLUCOSE-CAPILLARY: 110 mg/dL — AB (ref 65–99)
GLUCOSE-CAPILLARY: 107 mg/dL — AB (ref 65–99)
GLUCOSE-CAPILLARY: 151 mg/dL — AB (ref 65–99)
Glucose-Capillary: 111 mg/dL — ABNORMAL HIGH (ref 65–99)

## 2016-05-06 LAB — HEMOGLOBIN A1C
Hgb A1c MFr Bld: 6.5 % — ABNORMAL HIGH (ref 4.8–5.6)
Mean Plasma Glucose: 140 mg/dL

## 2016-05-06 LAB — MAGNESIUM: Magnesium: 1.8 mg/dL (ref 1.7–2.4)

## 2016-05-06 LAB — CBC
HCT: 29.6 % — ABNORMAL LOW (ref 36.0–46.0)
Hemoglobin: 9.3 g/dL — ABNORMAL LOW (ref 12.0–15.0)
MCH: 26.1 pg (ref 26.0–34.0)
MCHC: 31.4 g/dL (ref 30.0–36.0)
MCV: 83.1 fL (ref 78.0–100.0)
PLATELETS: 222 10*3/uL (ref 150–400)
RBC: 3.56 MIL/uL — AB (ref 3.87–5.11)
RDW: 14.3 % (ref 11.5–15.5)
WBC: 7.4 10*3/uL (ref 4.0–10.5)

## 2016-05-06 LAB — PHOSPHORUS: Phosphorus: 4.3 mg/dL (ref 2.5–4.6)

## 2016-05-06 MED ORDER — BENZONATATE 100 MG PO CAPS
100.0000 mg | ORAL_CAPSULE | Freq: Three times a day (TID) | ORAL | Status: DC
  Administered 2016-05-06 – 2016-05-08 (×4): 100 mg via ORAL
  Filled 2016-05-06 (×9): qty 1

## 2016-05-06 MED ORDER — VITAMIN D 1000 UNITS PO TABS
1000.0000 [IU] | ORAL_TABLET | Freq: Every day | ORAL | Status: DC
  Administered 2016-05-06 – 2016-05-08 (×3): 1000 [IU] via ORAL
  Filled 2016-05-06 (×3): qty 1

## 2016-05-06 MED ORDER — LINAGLIPTIN 5 MG PO TABS
5.0000 mg | ORAL_TABLET | Freq: Every day | ORAL | Status: DC
  Administered 2016-05-06 – 2016-05-08 (×3): 5 mg via ORAL
  Filled 2016-05-06 (×4): qty 1

## 2016-05-06 MED ORDER — ACETAMINOPHEN 325 MG PO TABS: 650.0000 mg | ORAL_TABLET | Freq: Four times a day (QID) | ORAL | Status: DC | PRN

## 2016-05-06 MED ORDER — PANTOPRAZOLE SODIUM 40 MG PO TBEC
40.0000 mg | DELAYED_RELEASE_TABLET | Freq: Every day | ORAL | Status: DC
Start: 2016-05-06 — End: 2016-05-06
  Administered 2016-05-06: 40 mg via ORAL
  Filled 2016-05-06: qty 1

## 2016-05-06 MED ORDER — HEPARIN SODIUM (PORCINE) 5000 UNIT/ML IJ SOLN
5000.0000 [IU] | Freq: Three times a day (TID) | INTRAMUSCULAR | Status: DC
  Administered 2016-05-06 – 2016-05-08 (×7): 5000 [IU] via SUBCUTANEOUS
  Filled 2016-05-06 (×8): qty 1

## 2016-05-06 NOTE — Progress Notes (Signed)
STROKE TEAM PROGRESS NOTE   SUBJECTIVE (INTERVAL HISTORY) Son and son in law are at bedside. Pt tolerated extubation without problem. Sitting in bed, AAOx 3. Stated that she can not remember whether she missed Xarelto dose or not but she thought she is taking Xarelto. Son is encourage to help her with medication at home. MRI showed left BG and insular cortex infarct with hemorrhagic transformation.    OBJECTIVE Temp:  [98.7 F (37.1 C)-99.1 F (37.3 C)] 98.8 F (37.1 C) (12/07 1200) Pulse Rate:  [62-115] 69 (12/07 1500) Cardiac Rhythm: Atrial fibrillation (12/07 0800) Resp:  [15-25] 21 (12/07 1500) BP: (96-132)/(41-92) 115/92 (12/07 1500) SpO2:  [93 %-100 %] 100 % (12/07 1500)  CBC:   Recent Labs Lab 05/04/16 1331 05/05/16 0545 05/06/16 0332  WBC 6.9 8.0 7.4  NEUTROABS 5.5 6.3  --   HGB 10.0* 9.9* 9.3*  HCT 30.4* 31.2* 29.6*  MCV 80.6 82.5 83.1  PLT 241 236 [id number]    Basic Metabolic Panel:   Recent Labs Lab 05/05/16 2031 05/06/16 0332  NA 145 144  K 3.8 3.6  CL 112* 111  CO2 23 23  GLUCOSE 105* 124*  BUN 7 8  CREATININE 0.62 0.64  CALCIUM 6.9* 6.7*  MG  --  1.8  PHOS  --  4.3    Lipid Panel:     Component Value Date/Time   CHOL 77 05/05/2016 0545   TRIG 77 05/05/2016 0545   HDL 30 (L) 05/05/2016 0545   CHOLHDL 2.6 05/05/2016 0545   VLDL 15 05/05/2016 0545   LDLCALC 32 05/05/2016 0545   HgbA1c:  Lab Results  Component Value Date   HGBA1C 6.5 (H) 05/05/2016   Urine Drug Screen:     Component Value Date/Time   LABOPIA NONE DETECTED 05/04/2016 2230   COCAINSCRNUR NONE DETECTED 05/04/2016 2230   LABBENZ NONE DETECTED 05/04/2016 2230   AMPHETMU NONE DETECTED 05/04/2016 2230   THCU NONE DETECTED 05/04/2016 2230   LABBARB NONE DETECTED 05/04/2016 2230      IMAGING I have personally reviewed the radiological images below and agree with the radiology interpretations.  Ct Angio Head and neck W Or Wo Contrast 05/04/2016 Occlusion left M1 segment with  hypoperfusion throughout the left MCA territory. No other intracranial stenosis Mild atherosclerotic disease in the carotid bifurcation bilaterally without significant carotid stenosis.    Ct Head Wo Contrast 05/04/2016 Hyperdense material seen within the superior aspect of the left major fissure. This may represent contrast or blood from recent intervention. Posterior aspect of the left lenticular nucleus was previously noted to be slightly hypodense which may indicate acute infarct. This area now demonstrates mild enhancement post angiography. Remote left frontal lobe and posterior right temporal -parietal lobe infarcts. Mild global atrophy without hydrocephalus. Hyperdense left middle cerebral artery M1 segment once again noted which may indicate presence of residual thrombus.  Ct Cervical Spine Wo Contrast 05/04/2016 Hyperdense left MCA worrisome for thrombus. Lack of visualization of a parenchymal infarct may be due to hyperacuity. ASPECTS score is 10/10. Remote posterior right MCA territory infarcted deep white matter infarct in the left frontal lobe. Atherosclerosis. C6-7 degenerative disc disease.   Cerebral Angiogram 04/04/2016 1Occluded LT MCA prox with Complete revascularization with x 1 pass with the 38mm x 40 mm Solitaire FR retrieval device and 3.6 mg of superselective intracranial Integrelin achieving a TICI 3 reperfusion  Mr Brain Wo Contrast Mr Jodene Nam Head Wo Contrast 05/05/2016 Acute hemorrhagic infarct left caudate and left putamen. Small areas of  acute infarct also in the left frontal temporal lobe and left insula. Chronic infarct right parietal lobe Successful re- canalization of left M1 occlusion. Left in middle cerebral artery branches are now widely patent on MRA.   TTE - Left ventricle: The cavity size was normal. Wall thickness was increased in a pattern of mild LVH. Systolic function was normal. The estimated ejection fraction was in the range of 55% to 60%. Wall motion was  normal; there were no regional wall motion abnormalities. - Aortic valve: Mildly calcified annulus. - Mitral valve: There was mild regurgitation. - Left atrium: The atrium was mildly dilated.   PHYSICAL EXAM  Temp:  [98.7 F (37.1 C)-99.1 F (37.3 C)] 98.8 F (37.1 C) (12/07 1200) Pulse Rate:  [62-115] 69 (12/07 1500) Resp:  [15-25] 21 (12/07 1500) BP: (96-132)/(41-92) 115/92 (12/07 1500) SpO2:  [93 %-100 %] 100 % (12/07 1500)  General - Well nourished, well developed, no in acute distress.  Ophthalmologic - Fundi not visualized due to ET tube distress.  Cardiovascular - regular rate and rhythm, not in afib today.  Mental Status -  Level of arousal and orientation to time, place, and person were intact. Language including expression, naming, repetition, comprehension was assessed and found intact. Fund of Knowledge was assessed and was intact.  Cranial Nerves II - XII - II - Visual field intact OU. III, IV, VI - Extraocular movements intact. V - Facial sensation intact bilaterally. VII - Facial movement intact bilaterally. VIII - Hearing & vestibular intact bilaterally. X - Palate elevates symmetrically. XI - Chin turning & shoulder shrug intact bilaterally. XII - Tongue protrusion intact.  Motor Strength - The patient's strength was normal in all extremities except RUE 4/5 and RLE 5-/5 and pronator drift was absent.  Bulk was normal and fasciculations were absent.   Motor Tone - Muscle tone was assessed at the neck and appendages and was normal.  Reflexes - The patient's reflexes were 1+ in all extremities and she had no pathological reflexes.  Sensory - Light touch, temperature/pinprick were assessed and were symmetrical.    Coordination - The patient had normal movements in the hands with no ataxia or dysmetria.  Tremor was absent.  Gait and Station - deferred   ASSESSMENT/PLAN Ms. TANASHA NUBER is a 75 y.o. female with history of atrial fibrillation, found  down with R hemiparesis and aphasia. She did not receive IV t-PA due to being on Xarelto. Found L MCA occlusion. Taken to IR where she received TICI 3 revascularization with [place] and Integrelin.    Stroke:  Dominant left MCA infarct, embolic secondary to known atrial fibrillation   Resultant  R mild hemiparesis  Code Stroke CT Hyperdense L MCA. Old right parietal and left CR infarct.  CTA head and neck occluded L M1 with L MCA territory hypoperfusion. Mild carotid disease w/o significant stenosis.   Cerebral angio occluded L MCA. TICI 3 reperfusion w/ solitaire and integrelin  Post IR CT Hyperdense materior uperior aspect of L major fissure. Likely L LN infarct given post con angio enhancement. hyperdnese L MCA M1  MRI  left caudate and left putamen hemorrhagic infarcts. Left frontal and left insular infarcts.  MRA unremarkable after IR  2D Echo  EF 55-60%. No source of embolus   LDL 32  HgbA1c 6.5  Heparin 5000 units sq tid for VTE prophylaxis Diet heart healthy/carb modified Room service appropriate? Yes; Fluid consistency: Thin  Xarelto (rivaroxaban) daily prior to admission, now on no antithrombotics  due to hemorrhagic transformation. Will repeat CT Sunday to consider resume antiplatelet. Consider transition to DOACs in 10-14 days if no frank blood on CT.   Ongoing aggressive stroke risk factor management  Therapy recommendations:  pending   Disposition:  pending   Atrial Fibrillation  Home anticoagulation:  Xarelto (rivaroxaban) daily continued in the hospital  xarelto on hold  Now on no antithrombotics due to hemorrhagic transformation  Will repeat CT Sunday to consider resume antiplatelet. Consider transition to DOACs in 10-14 days if no frank blood on CT.   Hypertension  Stable, on the low side  Continue IVF  Long-term BP goal normotensive  Hyperlipidemia  Home meds:  crestor 10  Hold off crestor for now due to hemorrhagic infarct and low LDL  LDL  32, goal < 70  Plan to continue statin at discharge  Diabetes  HgbA1c 6.5, goal < 7.0  CBG monitoring  Resume home DM meds   Other Stroke Risk Factors  Advanced age  Uses smokeless tobacco  Other Active Problems  Elevated troponin 0.4, likely related to AF  Hospital day # 2  This patient is critically ill due to left MCA infarct due to left MCA occlusion s/p IR, afib not on AC, relatively low BP and at significant risk of neurological worsening, death form recurrent stroke, hemorrhagic transformation, heart failure, brain herniation, shock. This patient's care requires constant monitoring of vital signs, hemodynamics, respiratory and cardiac monitoring, review of multiple databases, neurological assessment, discussion with family, other specialists and medical decision making of high complexity. I spent 35 minutes of neurocritical care time in the care of this patient.  Rosalin Hawking, MD PhD Stroke Neurology 05/06/2016 8:47 PM    To contact Stroke Continuity provider, please refer to [email or web address] After hours, contact General Neurology

## 2016-05-06 NOTE — Care Management Note (Signed)
Case Management Note  Patient Details  Name: Diana Cervantes MRN: [id number] Date of Birth: 1941/04/07  Subjective/Objective: Pt admitted on 05/04/16 s/p MCA stroke.  PTA, pt independent, lives with son.                     Action/Plan: Pt states son can provide 24h care at dc.  Await PT/OT evaluations for recommendations.    Expected Discharge Date:                  Expected Discharge Plan:  [place]  In-House Referral:     Discharge planning Services  CM Consult  Post Acute Care Choice:    Choice offered to:     DME Arranged:    DME Agency:     HH Arranged:    [place] Agency:     Status of Service:  In process, will continue to follow  If discussed at Long Length of Stay Meetings, dates discussed:    Additional Comments:  Reinaldo Raddle, RN, BSN  Trauma/Neuro ICU Case Manager [phone]
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
## 2016-05-06 NOTE — Progress Notes (Signed)
Orders received. Chart reviewed and discussed patient with RN. Patient has passed RN stroke swallow screen and consuming diet well per RN. Will defer swallow evaluation orders per protocol but will f/u for cognitive-linguistic evaluation 12/8.  [place], [place] [phone]

## 2016-05-07 DIAGNOSIS — E1159 Type 2 diabetes mellitus with other circulatory complications: Secondary | ICD-10-CM | POA: Diagnosis not present

## 2016-05-07 DIAGNOSIS — I63412 Cerebral infarction due to embolism of left middle cerebral artery: Secondary | ICD-10-CM | POA: Diagnosis not present

## 2016-05-07 DIAGNOSIS — I480 Paroxysmal atrial fibrillation: Secondary | ICD-10-CM | POA: Diagnosis not present

## 2016-05-07 DIAGNOSIS — E785 Hyperlipidemia, unspecified: Secondary | ICD-10-CM | POA: Diagnosis not present

## 2016-05-07 DIAGNOSIS — R531 Weakness: Secondary | ICD-10-CM | POA: Diagnosis not present

## 2016-05-07 DIAGNOSIS — I638 Other cerebral infarction: Secondary | ICD-10-CM | POA: Diagnosis not present

## 2016-05-07 DIAGNOSIS — I482 Chronic atrial fibrillation: Secondary | ICD-10-CM | POA: Diagnosis not present

## 2016-05-07 LAB — GLUCOSE, CAPILLARY: GLUCOSE-CAPILLARY: 115 mg/dL — AB (ref 65–99)

## 2016-05-07 NOTE — Progress Notes (Signed)
Patient is transferred from unit 74M to room 5M19 at this time. Alert and in stable condition.

## 2016-05-07 NOTE — Evaluation (Signed)
Physical Therapy Evaluation Patient Details Name: Diana Cervantes MRN: [id number] DOB: 1941-03-06 Today's Date: 05/07/2016   History of Present Illness  Patient is a 75 y/o female with hx of HTN, DM, A-fib and cataracts presents s/p fall at home. Found to have right sided weakness and aphasia. CT angiogram which demonstrated a left MCA occlusion s/p successful left MCA revascularization procedure. MRI-acute left BG and insular cortex infarct with hemorrhagic transformation  Clinical Impression  Patient presents with mild right sided weakness, incoordination RUE and impaired functional mobility s/p above. Pt independent and working PTA. Pt lives with son and he will be able to provide 24/7 supervision. Son putting up ramp today. Tolerated gait training with Min A for balance. Would benefit from use of RW for support until strength improves. Will follow acutely to maximize independence and mobility prior to return home.    Follow Up Recommendations Home health PT;Supervision for mobility/OOB;Supervision/Assistance - 24 hour    Equipment Recommendations  Rolling walker with 5" wheels    Recommendations for Other Services OT consult     Precautions / Restrictions Precautions Precautions: Fall Restrictions Weight Bearing Restrictions: No      Mobility  Bed Mobility Overal bed mobility: Needs Assistance Bed Mobility: Supine to Sit;Sit to Supine     Supine to sit: Supervision;HOB elevated Sit to supine: Supervision;HOB elevated   General bed mobility comments: Able to get to EOB without assist; use of rail. SOme effort to bring LEs into bed but no assist needed.  Transfers Overall transfer level: Needs assistance Equipment used: 1 person hand held assist Transfers: Sit to/from Stand Sit to Stand: Min guard         General transfer comment: Min guard for safety. Cues for technique.  Ambulation/Gait Ambulation/Gait assistance: Min assist Ambulation Distance (Feet): 50  Feet Assistive device: 1 person hand held assist Gait Pattern/deviations: Step-through pattern;Decreased stride length Gait velocity: decreased   General Gait Details: Slow, guarded gait with right knee instability noted but no buckling. May benefit from RW.  Stairs            Wheelchair Mobility    Modified Rankin (Stroke Patients Only) Modified Rankin (Stroke Patients Only) Pre-Morbid Rankin Score: No symptoms Modified Rankin: Moderately severe disability     Balance Overall balance assessment: Needs assistance Sitting-balance support: Feet supported;No upper extremity supported Sitting balance-Leahy Scale: Fair     Standing balance support: During functional activity Standing balance-Leahy Scale: Fair Standing balance comment: Able to stand statically without UE support but requires MIn A for dynamic standing/ambulation.                             Pertinent Vitals/Pain Pain Assessment: No/denies pain    Home Living Family/patient expects to be discharged to:: Private residence Living Arrangements: Children Available Help at Discharge: Family;Available 24 hours/day Type of Home: House Home Access: Stairs to enter Entrance Stairs-Rails: Right Entrance Stairs-Number of Steps: 3-4 (pt son putting up a ramp) Home Layout: One level Home Equipment: None      Prior Function Level of Independence: Independent         Comments: Still works, drives, cooks.     Hand Dominance        Extremity/Trunk Assessment   Upper Extremity Assessment:  (Impaired coordination RUE as noted during finger to thumb testing- slower then LUE)           Lower Extremity Assessment: RLE deficits/detail RLE Deficits /  Details: Grossly ~4/5 throughout.        Communication   Communication: HOH  Cognition Arousal/Alertness: Awake/alert Behavior During Therapy: WFL for tasks assessed/performed Overall Cognitive Status: Within Functional Limits for tasks  assessed                      General Comments General comments (skin integrity, edema, etc.): Family present during session.    Exercises     Assessment/Plan    PT Assessment Patient needs continued PT services  PT Problem List Decreased strength;Decreased mobility;Decreased coordination;Decreased balance;Decreased knowledge of use of DME          PT Treatment Interventions DME instruction;Therapeutic activities;Gait training;Therapeutic exercise;Patient/family education;Balance training;Functional mobility training;Stair training;Neuromuscular re-education    PT Goals (Current goals can be found in the Care Plan section)  Acute Rehab PT Goals Patient Stated Goal: to go home and return to normal PT Goal Formulation: With patient Time For Goal Achievement: 05/21/16 Potential to Achieve Goals: Good    Frequency Min 4X/week   Barriers to discharge Inaccessible home environment stairs to enter home but son putting up ramp    Co-evaluation               End of Session Equipment Utilized During Treatment: Gait belt Activity Tolerance: Patient tolerated treatment well Patient left: in bed;with call bell/phone within reach;with family/visitor present;with bed alarm set Nurse Communication: Mobility status         Time: 1425-1440 PT Time Calculation (min) (ACUTE ONLY): 15 min   Charges:   PT Evaluation $PT Eval Moderate Complexity: 1 Procedure     PT G Codes:        Matisse Salais A Annaclaire Walsworth 05/07/2016, 2:50 PM Wray Kearns, [employer], DPT [phone]

## 2016-05-07 NOTE — Care Management Important Message (Signed)
Important Message  Patient Details  Name: Diana Cervantes MRN: [id number] Date of Birth: 06-19-1940   Medicare Important Message Given:  Yes    Orbie Pyo 05/07/2016, 12:25 PM

## 2016-05-07 NOTE — Progress Notes (Signed)
Occupational Therapy Evaluation Patient Details Name: Diana Cervantes MRN: [id number] DOB: 08-25-1940 Today's Date: 05/07/2016    History of Present Illness Patient is a 75 y/o female with hx of HTN, DM, A-fib and cataracts presents s/p fall at home. Found to have right sided weakness and aphasia. CT angiogram which demonstrated a left MCA occlusion s/p successful left MCA revascularization procedure. MRI-acute left BG and insular cortex infarct with hemorrhagic transformation   Clinical Impression   PTA, pt independent with ADL and mobility. Pt presents with below deficits and will benefit from [facility] to facilitate return to PLOF. Will follow acutely to facilitate safe DC home with 24/7 S.    Follow Up Recommendations  Home health OT;Supervision/Assistance - 24 hour    Equipment Recommendations  3 in 1 bedside commode    Recommendations for Other Services       Precautions / Restrictions Precautions Precautions: Fall Restrictions Weight Bearing Restrictions: No      Mobility Bed Mobility Overal bed mobility: Needs Assistance Bed Mobility: Supine to Sit;Sit to Supine     Supine to sit: Supervision;HOB elevated Sit to supine: Supervision;HOB elevated   General bed mobility comments: Able to get to EOB without assist; use of rail. SOme effort to bring LEs into bed but no assist needed.  Transfers Overall transfer level: Needs assistance Equipment used: 1 person hand held assist Transfers: Sit to/from [facility] Sit to Stand: Min guard Stand pivot transfers: Min assist       General transfer comment: Min guard for safety. Cues for technique.    Balance Overall balance assessment: Needs assistance Sitting-balance support: Feet supported;No upper extremity supported Sitting balance-Leahy Scale: Fair     Standing balance support: During functional activity Standing balance-Leahy Scale: Fair Standing balance comment: Able to stand statically  without UE support but requires MIn A for dynamic standing/ambulation.                            ADL Overall ADL's : Needs assistance/impaired Eating/Feeding: Set up   Grooming: Set up;Supervision/safety   Upper Body Bathing: Set up;Supervision/ safety;Sitting   Lower Body Bathing: Moderate assistance;Sit to/from stand   Upper Body Dressing : Minimal assistance;Sitting   Lower Body Dressing: Moderate assistance;Sit to/from stand   Toilet Transfer: Minimal assistance;Stand-pivot;BSC   Toileting- Clothing Manipulation and Hygiene: Minimal assistance;Sit to/from stand       Functional mobility during ADLs: Minimal assistance;Rolling walker;Cueing for safety  Need to educate family on safety and ADL, BSC transfers, and reducing risk of falls.     Vision Vision Assessment?: Yes Eye Alignment: Within Functional Limits Ocular Range of Motion: Within Functional Limits Alignment/Gaze Preference: Within Defined Limits Tracking/Visual Pursuits: Able to track stimulus in all quads without difficulty Saccades: Within functional limits Convergence: Within functional limits Visual Fields: No apparent deficits  Wears glasses. Does not report changes in vision   Perception Perception Perception Tested?: No   Praxis Praxis Praxis tested?: Within functional limits    Pertinent Vitals/Pain Pain Assessment: No/denies pain     Hand Dominance Right   Extremity/Trunk Assessment Upper Extremity Assessment Upper Extremity Assessment: RUE deficits/detail RUE Deficits / Details: RUE weakness. unable to resist against gravity. isolated movement. Using hand to assist functionally. Dropping items frequently. States "it feels different". Brunstrom level V hand/arm RUE Sensation: decreased light touch RUE Coordination: decreased fine motor;decreased gross motor   Lower Extremity Assessment Lower Extremity Assessment: Defer to PT evaluation RLE  Deficits / Details: Grossly ~4/5  throughout.  RLE Sensation:  [facility].)   Cervical / Trunk Assessment Cervical / Trunk Assessment: Other exceptions Cervical / Trunk Exceptions: L bias. minimal posterior lean when distracted   Communication Communication Communication: HOH   Cognition Arousal/Alertness: Awake/alert Behavior During Therapy: WFL for tasks assessed/performed Overall Cognitive Status: No family/caregiver present to determine baseline cognitive functioning                 General Comments: slow processing. Will further assess   General Comments       Exercises       Shoulder Instructions      Home Living Family/patient expects to be discharged to:: Private residence Living Arrangements: Children Available Help at Discharge: Family;Available 24 hours/day Type of Home: House Home Access: Stairs to enter (son currently building ramp) Technical brewer of Steps: 3-4 Entrance Stairs-Rails: Right Home Layout: One level     Bathroom Shower/Tub: Tub/shower unit;Curtain Shower/tub characteristics: Architectural technologist: Standard Bathroom Accessibility: Yes How Accessible: Accessible via walker Home Equipment: Tub bench          Prior Functioning/Environment Level of Independence: Independent        Comments: Still works, drives, cooks.        OT Problem List: Decreased strength;Decreased range of motion;Decreased activity tolerance;Impaired balance (sitting and/or standing);Decreased coordination;Decreased safety awareness;Decreased knowledge of use of DME or AE;Impaired sensation;Impaired tone;Impaired UE functional use;Obesity   OT Treatment/Interventions: Self-care/ADL training;Therapeutic exercise;Neuromuscular education;DME and/or AE instruction;Therapeutic activities;Cognitive remediation/compensation;Patient/family education;Balance training    OT Goals(Current goals can be found in the care plan section) Acute Rehab OT Goals Patient Stated Goal: to go home and return  to normal OT Goal Formulation: With patient Time For Goal Achievement: 05/21/16 Potential to Achieve Goals: Good ADL Goals Pt Will Perform Upper Body Dressing: with supervision;with caregiver independent in assisting;sitting Pt Will Perform Lower Body Dressing: with min guard assist;with caregiver independent in assisting;sit to/from stand Pt Will Transfer to Toilet: with supervision;bedside commode;ambulating Additional ADL Goal #1: pt/family will verbalize 3 strategies to reduce risk of falls at home  OT Frequency: Min 2X/week   Barriers to D/C:            Co-evaluation              End of Session Nurse Communication: Mobility status  Activity Tolerance: Patient tolerated treatment well Patient left: in bed;with call bell/phone within reach;with bed alarm set (pt refused SDCs)   Time: [id number] OT Time Calculation (min): 24 min Charges:  OT General Charges $OT Visit: 1 Procedure OT Evaluation $OT Eval Moderate Complexity: 1 Procedure OT Treatments $Self Care/Home Management : 8-22 mins G-Codes:    Devanta Daniel,HILLARY 05/23/16, 3:55 PM   [facility], OT/L  [phone] 05-23-2016

## 2016-05-07 NOTE — Progress Notes (Signed)
STROKE TEAM PROGRESS NOTE   SUBJECTIVE (INTERVAL HISTORY) No family is at bedside. Pt doing well from neuro standpoint. Transferred to floor from neuro ICU. PT OT following with pt on the floor.    OBJECTIVE Temp:  [98.2 F (36.8 C)-99.1 F (37.3 C)] 98.2 F (36.8 C) (12/08 0314) Pulse Rate:  [58-84] 61 (12/08 0800) Cardiac Rhythm: Atrial fibrillation (12/07 2000) Resp:  [15-29] 20 (12/08 0800) BP: (81-143)/(45-92) 134/73 (12/08 0800) SpO2:  [94 %-100 %] 100 % (12/08 0800)  CBC:   Recent Labs Lab 05/04/16 1331 05/05/16 0545 05/06/16 0332  WBC 6.9 8.0 7.4  NEUTROABS 5.5 6.3  --   HGB 10.0* 9.9* 9.3*  HCT 30.4* 31.2* 29.6*  MCV 80.6 82.5 83.1  PLT 241 236 [id number]    Basic Metabolic Panel:   Recent Labs Lab 05/05/16 2031 05/06/16 0332  NA 145 144  K 3.8 3.6  CL 112* 111  CO2 23 23  GLUCOSE 105* 124*  BUN 7 8  CREATININE 0.62 0.64  CALCIUM 6.9* 6.7*  MG  --  1.8  PHOS  --  4.3    Lipid Panel:     Component Value Date/Time   CHOL 77 05/05/2016 0545   TRIG 77 05/05/2016 0545   HDL 30 (L) 05/05/2016 0545   CHOLHDL 2.6 05/05/2016 0545   VLDL 15 05/05/2016 0545   LDLCALC 32 05/05/2016 0545   HgbA1c:  Lab Results  Component Value Date   HGBA1C 6.5 (H) 05/05/2016   Urine Drug Screen:     Component Value Date/Time   LABOPIA NONE DETECTED 05/04/2016 2230   COCAINSCRNUR NONE DETECTED 05/04/2016 2230   LABBENZ NONE DETECTED 05/04/2016 2230   AMPHETMU NONE DETECTED 05/04/2016 2230   THCU NONE DETECTED 05/04/2016 2230   LABBARB NONE DETECTED 05/04/2016 2230      IMAGING I have personally reviewed the radiological images below and agree with the radiology interpretations.  Ct Angio Head and neck W Or Wo Contrast 05/04/2016 Occlusion left M1 segment with hypoperfusion throughout the left MCA territory. No other intracranial stenosis Mild atherosclerotic disease in the carotid bifurcation bilaterally without significant carotid stenosis.    Ct Head Wo  Contrast 05/04/2016 Hyperdense material seen within the superior aspect of the left major fissure. This may represent contrast or blood from recent intervention. Posterior aspect of the left lenticular nucleus was previously noted to be slightly hypodense which may indicate acute infarct. This area now demonstrates mild enhancement post angiography. Remote left frontal lobe and posterior right temporal -parietal lobe infarcts. Mild global atrophy without hydrocephalus. Hyperdense left middle cerebral artery M1 segment once again noted which may indicate presence of residual thrombus.  Ct Cervical Spine Wo Contrast 05/04/2016 Hyperdense left MCA worrisome for thrombus. Lack of visualization of a parenchymal infarct may be due to hyperacuity. ASPECTS score is 10/10. Remote posterior right MCA territory infarcted deep white matter infarct in the left frontal lobe. Atherosclerosis. C6-7 degenerative disc disease.   Cerebral Angiogram 04/04/2016 1Occluded LT MCA prox with Complete revascularization with x 1 pass with the 33mm x 40 mm Solitaire FR retrieval device and 3.6 mg of superselective intracranial Integrelin achieving a TICI 3 reperfusion  Mr Brain Wo Contrast Mr Jodene Nam Head Wo Contrast 05/05/2016 Acute hemorrhagic infarct left caudate and left putamen. Small areas of acute infarct also in the left frontal temporal lobe and left insula. Chronic infarct right parietal lobe Successful re- canalization of left M1 occlusion. Left in middle cerebral artery branches are now widely patent  on MRA.   TTE - Left ventricle: The cavity size was normal. Wall thickness was increased in a pattern of mild LVH. Systolic function was normal. The estimated ejection fraction was in the range of 55% to 60%. Wall motion was normal; there were no regional wall motion abnormalities. - Aortic valve: Mildly calcified annulus. - Mitral valve: There was mild regurgitation. - Left atrium: The atrium was mildly  dilated.   PHYSICAL EXAM  Temp:  [98.2 F (36.8 C)-99.1 F (37.3 C)] 98.2 F (36.8 C) (12/08 0314) Pulse Rate:  [58-84] 61 (12/08 0800) Resp:  [15-29] 20 (12/08 0800) BP: (81-143)/(45-92) 134/73 (12/08 0800) SpO2:  [94 %-100 %] 100 % (12/08 0800)  General - Well nourished, well developed, no in acute distress.  Ophthalmologic - Fundi not visualized due to ET tube distress.  Cardiovascular - regular rate and rhythm, not in afib today.  Mental Status -  Level of arousal and orientation to time, place, and person were intact. Language including expression, naming, repetition, comprehension was assessed and found intact. Fund of Knowledge was assessed and was intact.  Cranial Nerves II - XII - II - Visual field intact OU. III, IV, VI - Extraocular movements intact. V - Facial sensation intact bilaterally. VII - Facial movement intact bilaterally. VIII - Hearing & vestibular intact bilaterally. X - Palate elevates symmetrically. XI - Chin turning & shoulder shrug intact bilaterally. XII - Tongue protrusion intact.  Motor Strength - The patient's strength was normal in all extremities except RUE 4/5 and RLE 5-/5 and pronator drift was absent.  Bulk was normal and fasciculations were absent.   Motor Tone - Muscle tone was assessed at the neck and appendages and was normal.  Reflexes - The patient's reflexes were 1+ in all extremities and she had no pathological reflexes.  Sensory - Light touch, temperature/pinprick were assessed and were symmetrical.    Coordination - The patient had normal movements in the hands with no ataxia or dysmetria.  Tremor was absent.  Gait and Station - deferred   ASSESSMENT/PLAN Ms. Diana Cervantes is a 75 y.o. female with history of atrial fibrillation, found down with R hemiparesis and aphasia. She did not receive IV t-PA due to being on Xarelto. Found L MCA occlusion. Taken to IR where she received TICI 3 revascularization with [place] and  Integrelin.    Stroke:  Dominant left MCA infarct, embolic secondary to known atrial fibrillation   Resultant  R mild hemiparesis  Code Stroke CT Hyperdense L MCA. Old right parietal and left CR infarct.  CTA head and neck occluded L M1 with L MCA territory hypoperfusion. Mild carotid disease w/o significant stenosis.   Cerebral angio occluded L MCA. TICI 3 reperfusion w/ solitaire and integrelin  Post IR CT Hyperdense materior uperior aspect of L major fissure. Likely L LN infarct given post con angio enhancement. hyperdnese L MCA M1  MRI  left caudate and left putamen hemorrhagic infarcts. Left frontal and left insular infarcts.  MRA unremarkable after IR  2D Echo  EF 55-60%. No source of embolus   LDL 32  HgbA1c 6.5  Heparin 5000 units sq tid for VTE prophylaxis Diet heart healthy/carb modified Room service appropriate? Yes; Fluid consistency: Thin  Xarelto (rivaroxaban) daily prior to admission, now on no antithrombotics due to hemorrhagic transformation. Will repeat CT Saturday to consider resume antiplatelet. Consider transition to DOACs in 10-14 days if no frank blood on CT.   Ongoing aggressive stroke risk factor management  Therapy recommendations:  pending   Disposition:  pending   Atrial Fibrillation  Home anticoagulation:  Xarelto (rivaroxaban) daily continued in the hospital  Xarelto on hold due to hemorrhagic transformation.  Now on no antithrombotics due to hemorrhagic transformation  Will repeat CT Saturday to consider resume antiplatelet. Consider transition to DOACs in 10-14 days if no frank blood on CT.   Hypertension  Stable, on the low side  Continue IVF  Long-term BP goal normotensive  Hyperlipidemia  Home meds:  Crestor 10  Hold off crestor for now due to hemorrhagic infarct and low LDL  LDL 32, goal < 70  Plan to continue statin at discharge  Diabetes  HgbA1c 6.5, goal < 7.0  CBG monitoring  Home DM meds - resumed  Other  Stroke Risk Factors  Advanced age  Uses smokeless tobacco  Other Active Problems  Elevated troponin 0.4, likely related to AF  Hospital day # 3  Rosalin Hawking, MD PhD Stroke Neurology 05/07/2016 5:36 PM    To contact Stroke Continuity provider, please refer to [email or web address] After hours, contact General Neurology

## 2016-05-07 NOTE — Progress Notes (Signed)
Report given and pt's son updated of transfer to unit. Harris Penton, Rande Brunt, RN

## 2016-05-07 NOTE — Care Management Note (Signed)
Case Management Note  Patient Details  Name: Diana Cervantes MRN: [id number] Date of Birth: Oct 16, 1940  Subjective/Objective:                    Action/Plan: Pt with recommendations for walker and 3 in 1. Pt states she does not need the 3 in 1 and she has a walker at home. CM following.   Expected Discharge Date:                  Expected Discharge Plan:  [place]  In-House Referral:     Discharge planning Services  CM Consult  Post Acute Care Choice:    Choice offered to:     DME Arranged:    DME Agency:     HH Arranged:    [place] Agency:     Status of Service:  In process, will continue to follow  If discussed at Long Length of Stay Meetings, dates discussed:    Additional Comments:  Pollie Friar, RN 05/07/2016, 5:26 PM

## 2016-05-08 ENCOUNTER — Inpatient Hospital Stay (HOSPITAL_COMMUNITY): Payer: PPO

## 2016-05-08 ENCOUNTER — Other Ambulatory Visit: Payer: Self-pay | Admitting: Neurology

## 2016-05-08 DIAGNOSIS — E1159 Type 2 diabetes mellitus with other circulatory complications: Secondary | ICD-10-CM | POA: Diagnosis not present

## 2016-05-08 DIAGNOSIS — R531 Weakness: Secondary | ICD-10-CM | POA: Diagnosis not present

## 2016-05-08 DIAGNOSIS — I638 Other cerebral infarction: Secondary | ICD-10-CM | POA: Diagnosis not present

## 2016-05-08 DIAGNOSIS — R269 Unspecified abnormalities of gait and mobility: Secondary | ICD-10-CM | POA: Diagnosis not present

## 2016-05-08 DIAGNOSIS — I482 Chronic atrial fibrillation: Secondary | ICD-10-CM | POA: Diagnosis not present

## 2016-05-08 DIAGNOSIS — I480 Paroxysmal atrial fibrillation: Secondary | ICD-10-CM | POA: Diagnosis not present

## 2016-05-08 DIAGNOSIS — I6300 Cerebral infarction due to thrombosis of unspecified precerebral artery: Secondary | ICD-10-CM

## 2016-05-08 DIAGNOSIS — I63412 Cerebral infarction due to embolism of left middle cerebral artery: Secondary | ICD-10-CM | POA: Diagnosis not present

## 2016-05-08 DIAGNOSIS — E785 Hyperlipidemia, unspecified: Secondary | ICD-10-CM | POA: Diagnosis not present

## 2016-05-08 LAB — GLUCOSE, CAPILLARY
GLUCOSE-CAPILLARY: 117 mg/dL — AB (ref 65–99)
GLUCOSE-CAPILLARY: 139 mg/dL — AB (ref 65–99)

## 2016-05-08 MED ORDER — APIXABAN 2.5 MG PO TABS: 5.0000 mg | ORAL_TABLET | Freq: Two times a day (BID) | ORAL | Status: DC

## 2016-05-08 MED ORDER — ASPIRIN EC 325 MG PO TBEC: 325.0000 mg | DELAYED_RELEASE_TABLET | Freq: Every day | ORAL | 0 refills | Status: AC

## 2016-05-08 NOTE — Evaluation (Signed)
Speech Language Pathology Evaluation Patient Details Name: EDLIN PAPSON MRN: [id number] DOB: 25-Oct-1940 Today's Date: 05/08/2016 Time: [id number] SLP Time Calculation (min) (ACUTE ONLY): 20 min  Problem List:  Patient Active Problem List   Diagnosis Date Noted  . Cerebrovascular accident (CVA) due to thrombosis of precerebral artery ([place]) 05/04/2016  . Right sided weakness    Past Medical History:  Past Medical History:  Diagnosis Date  . Cataract   . Diabetes mellitus without complication ([place])   . Hypertension    Past Surgical History:  Past Surgical History:  Procedure Laterality Date  . ABDOMINAL HYSTERECTOMY    . CHOLECYSTECTOMY    . IR GENERIC HISTORICAL  05/04/2016   IR ANGIO INTRA EXTRACRAN SEL COM CAROTID INNOMINATE UNI R MOD SED 05/04/2016 Luanne Bras, MD [facility]  . IR GENERIC HISTORICAL  05/04/2016   IR ANGIO VERTEBRAL SEL VERTEBRAL UNI L MOD SED 05/04/2016 Luanne Bras, MD [facility]  . IR GENERIC HISTORICAL  05/04/2016   IR PERCUTANEOUS ART THROMBECTOMY/INFUSION INTRACRANIAL INC DIAG ANGIO 05/04/2016 Luanne Bras, MD [facility]  . RADIOLOGY WITH ANESTHESIA N/A 05/04/2016   Procedure: RADIOLOGY WITH ANESTHESIA;  Surgeon: Luanne Bras, MD;  Location: [place];  Service: Radiology;  Laterality: N/A;   HPI:  Patient is a 75 y/o female with hx of HTN, DM, A-fib and cataracts presents s/p fall at home. Found to have right sided weakness and aphasia. CT angiogram which demonstrated a left MCA occlusion s/p successful left MCA revascularization procedure. MRI-acute left BG and insular cortex infarct with hemorrhagic transformation   Assessment / Plan / Recommendation Clinical Impression  Patient presents with mild cognitive impairments in the areas of short term memory, problem solving, and reasoning which have the potential to impact ability to safely and efficiently carry out ADLs. Patient will benefit from acute care f/u to adress above areas.  Recommend [place] SLP after d/c and supervision at home.     SLP Assessment  Patient needs continued Speech Lanaguage Pathology Services    Follow Up Recommendations  Home health SLP    Frequency and Duration min 2x/week  2 weeks      SLP Evaluation Cognition  Overall Cognitive Status: No family/caregiver present to determine baseline cognitive functioning Arousal/Alertness: Awake/alert Orientation Level: Oriented X4 Attention: Sustained Sustained Attention: Appears intact Memory: Impaired Memory Impairment: Storage deficit;Retrieval deficit;Decreased recall of new information;Decreased short term memory Decreased Short Term Memory: Verbal basic;Functional basic Awareness: Impaired Awareness Impairment: Intellectual impairment Problem Solving: Impaired Problem Solving Impairment: Verbal complex;Functional complex Executive Function: Reasoning Reasoning: Impaired Reasoning Impairment: Functional complex;Verbal complex Safety/Judgment: Appears intact       Comprehension  Auditory Comprehension Overall Auditory Comprehension: Appears within functional limits for tasks assessed Visual Recognition/Discrimination Discrimination: Within Function Limits Reading Comprehension Reading Status: Within funtional limits    Expression Expression Primary Mode of Expression: Verbal Verbal Expression Overall Verbal Expression: Appears within functional limits for tasks assessed Written Expression Dominant Hand: Right Written Expression: Within Functional Limits (for spelling, legibility impacted by right UE sensory defici)   Oral / Motor  Oral Motor/Sensory Function Overall Oral Motor/Sensory Function: Within functional limits Motor Speech Overall Motor Speech: Appears within functional limits for tasks assessed   GO                   Gabriel Rainwater MA, CCC-SLP [phone]  Kenedi Cilia Meryl 05/08/2016, 9:56 AM

## 2016-05-08 NOTE — Discharge Summary (Signed)
Stroke Discharge Summary  Patient ID: Diana Cervantes   MRN: [id number]      DOB: 04/14/1941  Date of Admission: 05/04/2016 Date of Discharge: 05/08/2016  Attending Physician:  Diana Hawking, MD, Stroke MD Consulting Physician(s):   Treatment Team:  Md Stroke, MD pulmonary/intensive care  Dr Titus Mould Patient's PCP:  Diana Fendt, MD  DISCHARGE DIAGNOSIS: Acute infarct left basal ganglia stroke with hemorrhagic transformation. Active Problems:   Left MCA infarct s/p mechanical thrombectomy   Hemorrhagic infarct   afib on Xarelto   HLD   DM      BMI: Body mass index is 25.19 kg/m.  Past Medical History:  Diagnosis Date  . Cataract   . Diabetes mellitus without complication ([place])   . Hypertension    Past Surgical History:  Procedure Laterality Date  . ABDOMINAL HYSTERECTOMY    . CHOLECYSTECTOMY    . IR GENERIC HISTORICAL  05/04/2016   IR ANGIO INTRA EXTRACRAN SEL COM CAROTID INNOMINATE UNI R MOD SED 05/04/2016 Luanne Bras, MD [facility]  . IR GENERIC HISTORICAL  05/04/2016   IR ANGIO VERTEBRAL SEL VERTEBRAL UNI L MOD SED 05/04/2016 Luanne Bras, MD [facility]  . IR GENERIC HISTORICAL  05/04/2016   IR PERCUTANEOUS ART THROMBECTOMY/INFUSION INTRACRANIAL INC DIAG ANGIO 05/04/2016 Luanne Bras, MD [facility]  . RADIOLOGY WITH ANESTHESIA N/A 05/04/2016   Procedure: RADIOLOGY WITH ANESTHESIA;  Surgeon: Luanne Bras, MD;  Location: [facility];  Service: Radiology;  Laterality: N/A;      Medication List    STOP taking these medications   amLODipine 5 MG tablet Commonly known as:  NORVASC   rivaroxaban 20 MG Tabs tablet Commonly known as:  XARELTO   valsartan-hydrochlorothiazide 160-25 MG tablet Commonly known as:  DIOVAN-HCT     TAKE these medications   aspirin EC 325 MG tablet Take 1 tablet (325 mg total) by mouth daily. Start from 05/11/16 for 10 days Start taking on:  05/11/2016   benzonatate 100 MG capsule Commonly known as:   TESSALON Take 1 capsule (100 mg total) by mouth every 8 (eight) hours.   CALCIUM PO Take 1 tablet by mouth daily.   PROBIOTIC DAILY PO Take 1 tablet by mouth daily.   rosuvastatin 10 MG tablet Commonly known as:  CRESTOR Take 10 mg by mouth daily.   SUPER B COMPLEX PO Take 1 tablet by mouth daily.   TRADJENTA 5 MG Tabs tablet Generic drug:  linagliptin Take 5 mg by mouth daily.   Vitamin D-3 1000 units Caps Take 1,000 Units by mouth daily.            Durable Medical Equipment        Start     Ordered   05/08/16 1446  For home use only DME Walker rolling  Once    Question:  Patient needs a walker to treat with the following condition  Answer:  Hemorrhagic stroke (Diana Cervantes)   05/08/16 1446   05/08/16 1327  For home use only DME 3 n 1  Once     05/08/16 1326      LABORATORY STUDIES CBC    Component Value Date/Time   WBC 7.4 05/06/2016 0332   RBC 3.56 (L) 05/06/2016 0332   HGB 9.3 (L) 05/06/2016 0332   HCT 29.6 (L) 05/06/2016 0332   PLT 222 05/06/2016 0332   MCV 83.1 05/06/2016 0332   MCH 26.1 05/06/2016 0332   MCHC 31.4 05/06/2016 0332   RDW 14.3  05/06/2016 0332   LYMPHSABS 1.1 05/05/2016 0545   MONOABS 0.6 05/05/2016 0545   EOSABS 0.1 05/05/2016 0545   BASOSABS 0.0 05/05/2016 0545   CMP    Component Value Date/Time   NA 144 05/06/2016 0332   K 3.6 05/06/2016 0332   CL 111 05/06/2016 0332   CO2 23 05/06/2016 0332   GLUCOSE 124 (H) 05/06/2016 0332   BUN 8 05/06/2016 0332   CREATININE 0.64 05/06/2016 0332   CALCIUM 6.7 (L) 05/06/2016 0332   PROT 5.6 (L) 05/05/2016 0545   ALBUMIN 2.9 (L) 05/05/2016 0545   AST 20 05/05/2016 0545   ALT 15 05/05/2016 0545   ALKPHOS 61 05/05/2016 0545   BILITOT 0.5 05/05/2016 0545   GFRNONAA >60 05/06/2016 0332   GFRAA >60 05/06/2016 0332   COAGS Lab Results  Component Value Date   INR 1.05 05/04/2016   Lipid Panel    Component Value Date/Time   CHOL 77 05/05/2016 0545   TRIG 77 05/05/2016 0545   HDL 30 (L)  05/05/2016 0545   CHOLHDL 2.6 05/05/2016 0545   VLDL 15 05/05/2016 0545   LDLCALC 32 05/05/2016 0545   HgbA1C  Lab Results  Component Value Date   HGBA1C 6.5 (H) 05/05/2016   Cardiac Panel (last 3 results) No results for input(s): CKTOTAL, CKMB, TROPONINI, RELINDX in the last 72 hours. Urinalysis    Component Value Date/Time   COLORURINE YELLOW 05/04/2016 2231   APPEARANCEUR CLEAR 05/04/2016 2231   LABSPEC 1.016 05/04/2016 2231   PHURINE 5.0 05/04/2016 2231   GLUCOSEU NEGATIVE 05/04/2016 2231   HGBUR SMALL (A) 05/04/2016 2231   BILIRUBINUR NEGATIVE 05/04/2016 2231   KETONESUR NEGATIVE 05/04/2016 2231   PROTEINUR NEGATIVE 05/04/2016 2231   NITRITE NEGATIVE 05/04/2016 2231   LEUKOCYTESUR TRACE (A) 05/04/2016 2231   Urine Drug Screen     Component Value Date/Time   LABOPIA NONE DETECTED 05/04/2016 2230   COCAINSCRNUR NONE DETECTED 05/04/2016 2230   LABBENZ NONE DETECTED 05/04/2016 2230   AMPHETMU NONE DETECTED 05/04/2016 2230   THCU NONE DETECTED 05/04/2016 2230   LABBARB NONE DETECTED 05/04/2016 2230    Alcohol Level    Component Value Date/Time   ETH <5 05/04/2016 0934     SIGNIFICANT DIAGNOSTIC STUDIES  Ct Head Wo Contrast 05/08/2016 Acute infarct left basal ganglia unchanged in distribution from the recent MRI 05/05/2016.  Small amount of associated hemorrhage also similar to the MRI.  No new area of hemorrhage or infarction.   Ct Angio Head and neck W Or Wo Contrast 05/04/2016 Occlusion left M1 segment with hypoperfusion throughout the left MCA territory. No other intracranial stenosis Mild atherosclerotic disease in the carotid bifurcation bilaterally without significant carotid stenosis.     Ct Head Wo Contrast 05/04/2016 Hyperdense material seen within the superior aspect of the left major fissure. This may represent contrast or blood from recent intervention. Posterior aspect of the left lenticular nucleus was previously noted to be slightly hypodense  which may indicate acute infarct. This area now demonstrates mild enhancement post angiography. Remote left frontal lobe and posterior right temporal -parietal lobe infarcts. Mild global atrophy without hydrocephalus. Hyperdense left middle cerebral artery M1 segment once again noted which may indicate presence of residual thrombus.   Ct Cervical Spine Wo Contrast 05/04/2016 Hyperdense left MCA worrisome for thrombus. Lack of visualization of a parenchymal infarct may be due to hyperacuity. ASPECTS score is 10/10. Remote posterior right MCA territory infarcted deep white matter infarct in the left frontal lobe. Atherosclerosis.  C6-7 degenerative disc disease.     Cerebral Angiogram 04/04/2016 1Occluded LT MCA prox with Complete revascularization with x 1 pass with the 39mm x 40 mm Solitaire FR retrieval device and 3.6 mg of superselective intracranial Integrelin achieving a TICI 3 reperfusion    Mr Brain Wo Contrast Mr Jodene Nam Head Wo Contrast 05/05/2016 Acute hemorrhagic infarct left caudate and left putamen. Small areas of acute infarct also in the left frontal temporal lobe and left insula. Chronic infarct right parietal lobe Successful re- canalization of left M1 occlusion. Left in middle cerebral artery branches are now widely patent on MRA.     TTE - Left ventricle: The cavity size was normal. Wall thickness wasincreased in a pattern of mild LVH. Systolic function was normal.The estimated ejection fraction was in the range of 55% to 60%.Wall motion was normal; there were no regional wall motionabnormalities. - Aortic valve: Mildly calcified annulus. - Mitral valve: There was mild regurgitation. - Left atrium: The atrium was mildly dilated.      HISTORY OF PRESENT ILLNESS  Diana Cervantes is a 75 y.o. female with a historyi of diabetes and hypertension who takes Xarelto for atrial fibrillation. She was in her normal state of health at 6 AM on the day of admission when her son  talked to her in the morning. She was getting ready for a hair appointment when she fell in the bathroom. Her son found her on the ground, with severe right hemiparesis and aphasia. EMS was called and the patient was transported as a code stroke. She was taken for a CT angiogram which demonstrated a left MCA occlusion. She was not a candidate for TPA due to anticoagulation, but was taken for mechanical intervention. In interventional radiology she received TICI 3 revascularization with Soltaire and Integrelin.   she was admitted to the neuro intensive care unit following the procedure.   HOSPITAL COURSE Ms. SALICE KNAPE is a 75 y.o. female with history of atrial fibrillation, found down with R hemiparesis and aphasia. She did not receive IV t-PA due to being on Xarelto. Found to have a L MCA occlusion. Taken to IR where she received TICI 3 revascularization with Soltaire and Integrelin.  She was subsequently admitted to the neuro intensive care unit for close monitoring.  Stroke:  Dominant left MCA infarct, embolic secondary to known atrial fibrillation   Resultant  R mild hemiparesis  Code Stroke CT Hyperdense L MCA. Old right parietal and left CR infarct.  CTA head and neck occluded L M1 with L MCA territory hypoperfusion. Mild carotid disease w/o significant stenosis.   Cerebral angio occluded L MCA. TICI 3 reperfusion w/ solitaire and integrelin  Post IR CT Hyperdense materior uperior aspect of L major fissure. Likely L LN infarct given post con angio enhancement. hyperdnese L MCA M1  MRI  left caudate and left putamen hemorrhagic infarcts. Left frontal and left insular infarcts.  MRA unremarkable after IR  Ct Head Wo Contrast - 05/08/2016 - Acute infarct left basal ganglia unchanged. Small amount of associated hemorrhage.  2D Echo  EF 55-60%. No source of embolus   LDL 32  HgbA1c 6.5  Heparin 5000 units sq tid for VTE prophylaxis  Diet heart healthy/carb modified Room  service appropriate? Yes; Fluid consistency: Thin  Xarelto (rivaroxaban) daily prior to admission - change to Eliquis when anticoagulation is restarted.    Ongoing aggressive stroke risk factor management  Therapy recommendations: Home Health PT, OT, and Speech recommended  Disposition: Discharge  to home  Atrial Fibrillation  Home anticoagulation:  Xarelto (rivaroxaban) daily continued in the hospital  Xarelto on hold due to hemorrhagic transformation.  Now on no antithrombotics due to hemorrhagic transformation   Hypertension  Stable, on the low side  Continue IVF              Long-term BP goal normotensive  Hyperlipidemia  Home meds:  Crestor 10  Hold off crestor for now due to hemorrhagic infarct and low LDL  LDL 32, goal < 70  Plan to continue statin at discharge  Diabetes  HgbA1c 6.5, goal < 7.0  CBG monitoring  Home DM meds - resumed  Other Stroke Risk Factors  Advanced age  Uses smokeless tobacco  Other Active Problems  Elevated troponin 0.4, likely related to AF   DISCHARGE EXAM Blood pressure (!) 109/46, pulse 81, temperature 98.4 F (36.9 C), temperature source Oral, resp. rate 20, height 5\' 6"  (1.676 m), weight 156 lb 1.4 oz (70.8 kg), SpO2 96 %. General - Well nourished, well developed, no in acute distress.  Ophthalmologic - Fundi not visualized due to ET tube distress.  Cardiovascular - regular rate and rhythm, not in afib today.  Mental Status -  Level of arousal and orientation to time, place, and person were intact. Language including expression, naming, repetition, comprehension was assessed and found intact. Fund of Knowledge was assessed and was intact.  Cranial Nerves II - XII - II - Visual field intact OU. III, IV, VI - Extraocular movements intact. V - Facial sensation intact bilaterally. VII - Facial movement intact bilaterally. VIII - Hearing & vestibular intact bilaterally. X - Palate elevates  symmetrically. XI - Chin turning & shoulder shrug intact bilaterally. XII - Tongue protrusion intact.  Motor Strength - The patient's strength was normal in all extremities except RUE 4/5 and RLE 5-/5 and pronator drift was absent.  Bulk was normal and fasciculations were absent.   Motor Tone - Muscle tone was assessed at the neck and appendages and was normal.  Reflexes - The patient's reflexes were 1+ in all extremities and she had no pathological reflexes.  Sensory - Light touch, temperature/pinprick were assessed and were symmetrical.    Coordination - The patient had normal movements in the hands with no ataxia or dysmetria.  Tremor was absent.  Gait and Station - deferred  Patient Discharge Instructions  Home health Physical, Occupational, and Speech therapies.  Close supervision needed at home for safety.  Resume aspirin 325 mg daily 1 week after the initial stroke on - Tuesday 05/11/2016.  Repeat head CT on or about 05/19/2016. Office will arrange.  Based on CT results you may be instructed to discontinue the aspirin and start Eliquis 5 mg twice daily on Friday 05/21/2016. Eliquis will take the place of Xarelto. Both are newer anticoagulants (blood thinners)  Discharge Diet   Diet heart healthy/carb modified Room service appropriate? Yes; Fluid consistency: Thin liquids  DISCHARGE PLAN  Disposition:  Discharge to home with close supervision.  No antithrombotic for secondary stroke prevention at this time secondary to hemorrhage. (See plan below)  Ongoing risk factor control by Primary Care Physician at time of discharge  Follow-up Diana Fendt, MD in 2 weeks.  Follow-up with Dr. Rosalin Cervantes, Stroke Clinic in 6 weeks, office to schedule an appointment.  Home health Physical, Occupational, and Speech therapies.  Resume aspirin 325 mg daily on 05/11/2016  Repeat head CT on or about 05/19/2016.  If hemorrhage has resolved by  imaging discontinue aspirin and  start Eliquis 5 mg twice daily on 05/21/2016.   40 minutes were spent preparing discharge.  Diana Hawking, MD PhD Stroke Neurology 05/08/2016 3:19 PM

## 2016-05-08 NOTE — Progress Notes (Signed)
Understands d/c home instr. Awaiting HH and rolling walker. Home soon w/family.

## 2016-05-08 NOTE — Progress Notes (Signed)
Physical Therapy Treatment Patient Details Name: Diana Cervantes MRN: [id number] DOB: 1941-05-16 Today's Date: 05/08/2016    History of Present Illness Patient is a 75 y/o female with hx of HTN, DM, A-fib and cataracts presents s/p fall at home. Found to have right sided weakness and aphasia. CT angiogram which demonstrated a left MCA occlusion s/p successful left MCA revascularization procedure. MRI-acute left BG and insular cortex infarct with hemorrhagic transformation    PT Comments    Pt gait trained with/without RW.  Pt much more safe with the RW, but does need to work with HHPT to get as steady as possible.  Follow Up Recommendations  Home health PT;Supervision for mobility/OOB;Supervision/Assistance - 24 hour     Equipment Recommendations  Rolling walker with 5" wheels    Recommendations for Other Services       Precautions / Restrictions Precautions Precautions: Fall Restrictions Weight Bearing Restrictions: Yes    Mobility  Bed Mobility               General bed mobility comments: OOB in recliner  Transfers Overall transfer level: Needs assistance Equipment used: 2 person hand held assist;Rolling walker (2 wheeled) Transfers: Sit to/from Stand Sit to Stand: Min guard         General transfer comment: cues for hand placement, but   Ambulation/Gait Ambulation/Gait assistance: Min assist;Min guard Ambulation Distance (Feet): 160 Feet (then 60 feet to stairs.) Assistive device: Rolling walker (2 wheeled);1 person hand held assist Gait Pattern/deviations: Step-through pattern;Decreased stride length   Gait velocity interpretation: Below normal speed for age/gender General Gait Details: still slow and mildly unsteady.  R LE weakness   Stairs Stairs: Yes   Stair Management: One rail Left;Step to pattern;Forwards Number of Stairs: 3 General stair comments: needs minimal assist for safety  Wheelchair Mobility    Modified Rankin (Stroke  Patients Only) Modified Rankin (Stroke Patients Only) Pre-Morbid Rankin Score: No symptoms Modified Rankin: Moderate disability     Balance Overall balance assessment: Needs assistance Sitting-balance support: Feet supported;No upper extremity supported Sitting balance-Leahy Scale: Fair       Standing balance-Leahy Scale: Fair                      Cognition Arousal/Alertness: Awake/alert Behavior During Therapy: WFL for tasks assessed/performed Overall Cognitive Status: Within Functional Limits for tasks assessed                      Exercises      General Comments General comments (skin integrity, edema, etc.): Needs RW for safety      Pertinent Vitals/Pain Pain Assessment: No/denies pain    Home Living                      Prior Function            PT Goals (current goals can now be found in the care plan section) Acute Rehab PT Goals Patient Stated Goal: to go home and return to normal PT Goal Formulation: With patient Time For Goal Achievement: 05/21/16 Potential to Achieve Goals: Good Progress towards PT goals: Progressing toward goals    Frequency    Min 4X/week      PT Plan Current plan remains appropriate    Co-evaluation             End of Session   Activity Tolerance: Patient tolerated treatment well Patient left: in chair;with call bell/phone within reach;with family/visitor  present     Time: [id number] PT Time Calculation (min) (ACUTE ONLY): 18 min  Charges:  $Gait Training: 8-22 mins                    G CodesTessie Fass Diana Cervantes 05/08/2016, 3:25 PM 05/08/2016  Diana Cervantes, PT [phone]  (pager)

## 2016-05-08 NOTE — Discharge Instructions (Signed)
·   Home health Physical, Occupational, and Speech therapies.  Close supervision needed at home for safety.  Resume aspirin 325 mg daily 1 week after the initial stroke on Tuesday 05/11/2016  Repeat head CT on or about 05/19/2016. Office will arrange.  Based on CT results you may be instructed to discontinue the aspirin and start Eliquis 5 mg twice daily on Friday 05/21/2016. You will need a prescription called in for this medication. Eliquis will take the place of Xarelto. Both are newer anticoagulants (blood thinners)

## 2016-05-08 NOTE — Care Management Note (Signed)
Case Management Note  Patient Details  Name: Diana Cervantes MRN: [id number] Date of Birth: 1940/11/20  Subjective/Objective:                  Acute infarct left basal ganglia stroke with hemorrhagic transformation. Action/Plan: DISCHARGE PLANNING Expected Discharge Date:  05/08/16               Expected Discharge Plan:  [place]  In-House Referral:     Discharge planning Services  CM Consult  Post Acute Care Choice:  Home Health Choice offered to:  Patient  DME Arranged:  3-N-1, Walker rolling DME Agency:  [facility]:  PT, OT, Speech Therapy [place] Agency:  [facility]  Status of Service:  completed  If discussed at [place] of Stay Meetings, dates discussed:    Additional Comments: CM spoke with family for choice and family chooses [facility] to render HHPT/OT/SLP.  Referral called to [facility] DME rep, Jermaine. CM notified [facility] DME rep, Reggie to please deliver the rolling walker and 3n1 to room prior to discharge.  NO other CM needs were communicated.  Dellie Catholic, RN 05/08/2016, 4:34 PM

## 2016-05-11 DIAGNOSIS — I4891 Unspecified atrial fibrillation: Secondary | ICD-10-CM | POA: Diagnosis not present

## 2016-05-11 DIAGNOSIS — E785 Hyperlipidemia, unspecified: Secondary | ICD-10-CM | POA: Diagnosis not present

## 2016-05-11 DIAGNOSIS — I10 Essential (primary) hypertension: Secondary | ICD-10-CM | POA: Diagnosis not present

## 2016-05-11 DIAGNOSIS — E119 Type 2 diabetes mellitus without complications: Secondary | ICD-10-CM | POA: Diagnosis not present

## 2016-05-11 DIAGNOSIS — Z7982 Long term (current) use of aspirin: Secondary | ICD-10-CM | POA: Diagnosis not present

## 2016-05-11 DIAGNOSIS — F17220 Nicotine dependence, chewing tobacco, uncomplicated: Secondary | ICD-10-CM | POA: Diagnosis not present

## 2016-05-11 DIAGNOSIS — R296 Repeated falls: Secondary | ICD-10-CM | POA: Diagnosis not present

## 2016-05-11 DIAGNOSIS — I69328 Other speech and language deficits following cerebral infarction: Secondary | ICD-10-CM | POA: Diagnosis not present

## 2016-05-11 DIAGNOSIS — I69351 Hemiplegia and hemiparesis following cerebral infarction affecting right dominant side: Secondary | ICD-10-CM | POA: Diagnosis not present

## 2016-05-11 DIAGNOSIS — Z7901 Long term (current) use of anticoagulants: Secondary | ICD-10-CM | POA: Diagnosis not present

## 2016-05-11 DIAGNOSIS — Z7984 Long term (current) use of oral hypoglycemic drugs: Secondary | ICD-10-CM | POA: Diagnosis not present

## 2016-05-13 ENCOUNTER — Telehealth (HOSPITAL_COMMUNITY): Payer: Self-pay

## 2016-05-13 NOTE — Telephone Encounter (Signed)
Called to schedule f/u, left message for pt to return call. AW 

## 2016-05-18 ENCOUNTER — Telehealth: Payer: Self-pay | Admitting: Neurology

## 2016-05-18 NOTE — Telephone Encounter (Signed)
Talked with son. Pt is not on ASA due to elevated BP. I told him that ASA has nothing to do with BP and she should continue to take it until eliquis starts.   I have ordered CT head without contrast on 05/08/16 to be done on 05/19/16. It should be approved for the last 10 days. We need to find out the status. Hi, Katrina, can you check it out? Thanks.  Rosalin Hawking, MD PhD Stroke Neurology 05/18/2016 12:30 PM

## 2016-05-18 NOTE — Telephone Encounter (Signed)
Diana Cervantes is working on authorization for patient. Pt has not been seen at [facility]. HAve tot look at hospital notes.

## 2016-05-18 NOTE — Telephone Encounter (Signed)
Patient's son is calling stating the patient is supposed to have a CT scan scheduled. Dr. Erlinda Hong saw the patient in the hospital.

## 2016-05-18 NOTE — Telephone Encounter (Signed)
Rn call patients son Diana Cervantes about the Ct scan and the eliquis.Diana Cervantes stated his mom was schedule to have a Ct scan this month. Rn explain his mom just had a Ct scan two weeks and Dr. Erlinda Hong wanted another scan in 30 days. Rn also explain that the referral are aware about the need to submit it to the insurance. Rn stated they will get a call if its approve or not. ALos if  Its approve she will be schedule next year in January 2018. Pts son verbalized understanding.  Pts son Diana Cervantes also call about his mom being on aspirin. He stated the aspirin made her blood pressure elevated in the [id number] systolic. Rn explain aspirin usually does not make a person hypertensive. Rn explain a message will be sent to Dr. Erlinda Hong about this issue. PT does have an appt with her PCP in the next two weeks. PT son also ask about the eliquis. Rn stated per the medication note, pt is to not start eliquis until after the second Ct scan is done, and Dr. Erlinda Hong has reviewed the images. Rn also schedule the hospital follow up with Dr. Erlinda Hong. Pts referral was sent 05/08/2016.

## 2016-05-19 DIAGNOSIS — R358 Other polyuria: Secondary | ICD-10-CM | POA: Diagnosis not present

## 2016-05-19 DIAGNOSIS — R8299 Other abnormal findings in urine: Secondary | ICD-10-CM | POA: Diagnosis not present

## 2016-05-19 NOTE — Telephone Encounter (Signed)
Dr. Ardeth Perfect with [facility] would like  to speak with Dr. Erlinda Hong.  Dr. Ardeth Perfect was advised patients CT is being worked on and pending insurance authorization.

## 2016-05-19 NOTE — Telephone Encounter (Signed)
I have called Dr. Ardeth Perfect and we have discussed about pt over the phone. Thank you.  Rosalin Hawking, MD PhD Stroke Neurology 05/19/2016 11:38 AM

## 2016-05-26 ENCOUNTER — Encounter (HOSPITAL_COMMUNITY): Payer: Self-pay | Admitting: Nurse Practitioner

## 2016-05-26 ENCOUNTER — Ambulatory Visit (HOSPITAL_COMMUNITY)
Admission: RE | Admit: 2016-05-26 | Discharge: 2016-05-26 | Disposition: A | Discharge status: Home / Self Care | Payer: PPO | Source: Ambulatory Visit | Attending: Nurse Practitioner | Admitting: Nurse Practitioner

## 2016-05-26 VITALS — BP 158/82 | HR 99 | Ht 62.0 in | Wt 158.4 lb

## 2016-05-26 DIAGNOSIS — E119 Type 2 diabetes mellitus without complications: Secondary | ICD-10-CM | POA: Insufficient documentation

## 2016-05-26 DIAGNOSIS — I481 Persistent atrial fibrillation: Secondary | ICD-10-CM | POA: Diagnosis not present

## 2016-05-26 DIAGNOSIS — I4819 Other persistent atrial fibrillation: Secondary | ICD-10-CM

## 2016-05-26 DIAGNOSIS — Z9049 Acquired absence of other specified parts of digestive tract: Secondary | ICD-10-CM | POA: Insufficient documentation

## 2016-05-26 DIAGNOSIS — Z87891 Personal history of nicotine dependence: Secondary | ICD-10-CM | POA: Diagnosis not present

## 2016-05-26 DIAGNOSIS — Z7982 Long term (current) use of aspirin: Secondary | ICD-10-CM | POA: Insufficient documentation

## 2016-05-26 DIAGNOSIS — Z8673 Personal history of transient ischemic attack (TIA), and cerebral infarction without residual deficits: Secondary | ICD-10-CM | POA: Diagnosis not present

## 2016-05-26 DIAGNOSIS — I480 Paroxysmal atrial fibrillation: Secondary | ICD-10-CM | POA: Diagnosis not present

## 2016-05-26 DIAGNOSIS — I10 Essential (primary) hypertension: Secondary | ICD-10-CM | POA: Diagnosis not present

## 2016-05-26 NOTE — Progress Notes (Signed)
Primary Care Physician: Philis Fendt, MD Referring Physician: [facility] f/u   Diana Cervantes is a 75 y.o. female that in the afib clinic for f/u of recent CVA and persistent atrial fibrillation. Pt was first diagnosed in May of this year after presenting with an URI. She was given RX for  xarelto and cardizem and asked to f/u in the afib clinic. Several attempts were made to contact pt as well as messages left on machine and letters were sent and she never followed up. She also did not start her meds.  She then presented to [facility] 12/5 after she fell in the bathroom and her son found her with severe right hemiparesis and aphagia.She arrived via EMS as a code stroke. CT angiogram demonstrated a left MCA occlusion. SHe was not a TPA candidate due to anticoagulation which was listed on the chart but pt had never started. She went to IR and received TICI revascularization with Soltaire and Integrelin.  In the clinic, pt does not appear to have any neuro deficients.She however was not started on DOAC due to small amount of hemorrhage associated with infarct. She was discharged on 325 mg of ASA which pt decreased on her onw to 81 mg of asa a day due to thinking it made her BP higher and was scheduled for a repeat CT of the brain to make sure bleeding was resolved. However, thru insurance issues the CT of the head  Ws delayed and is not scheduled until Friday and after review, will be given the OK to start [place], per pt and son when they recently saw PCP. The son will  pick up from the drugstore today to have ready to start. She continues to be in asymptomatic afib.   Today, she denies symptoms of palpitations, chest pain, shortness of breath, orthopnea, PND, lower extremity edema, dizziness, presyncope, syncope, or neurologic sequela. The patient is tolerating medications without difficulties and is otherwise without complaint today.   Past Medical History:  Diagnosis Date  . Cataract   . Diabetes mellitus  without complication ([place])   . Hypertension    Past Surgical History:  Procedure Laterality Date  . ABDOMINAL HYSTERECTOMY    . CHOLECYSTECTOMY    . IR GENERIC HISTORICAL  05/04/2016   IR ANGIO INTRA EXTRACRAN SEL COM CAROTID INNOMINATE UNI R MOD SED 05/04/2016 Luanne Bras, MD [facility]  . IR GENERIC HISTORICAL  05/04/2016   IR ANGIO VERTEBRAL SEL VERTEBRAL UNI L MOD SED 05/04/2016 Luanne Bras, MD [facility]  . IR GENERIC HISTORICAL  05/04/2016   IR PERCUTANEOUS ART THROMBECTOMY/INFUSION INTRACRANIAL INC DIAG ANGIO 05/04/2016 Luanne Bras, MD [facility]  . RADIOLOGY WITH ANESTHESIA N/A 05/04/2016   Procedure: RADIOLOGY WITH ANESTHESIA;  Surgeon: Luanne Bras, MD;  Location: [place];  Service: Radiology;  Laterality: N/A;    Current Outpatient Prescriptions  Medication Sig Dispense Refill  . aspirin EC 81 MG tablet Take 81 mg by mouth daily.    . B Complex-C (SUPER B COMPLEX PO) Take 1 tablet by mouth daily.     Marland Kitchen CALCIUM PO Take 1 tablet by mouth daily.     . Cholecalciferol (VITAMIN D-3) 1000 units CAPS Take 1,000 Units by mouth daily.     Marland Kitchen diltiazem (CARDIZEM CD) 120 MG 24 hr capsule Take 120 mg by mouth daily.    Marland Kitchen linagliptin (TRADJENTA) 5 MG TABS tablet Take 5 mg by mouth daily.    . Probiotic Product (PROBIOTIC DAILY PO) Take 1  tablet by mouth daily.     . rosuvastatin (CRESTOR) 10 MG tablet Take 10 mg by mouth daily.     Current Facility-Administered Medications  Medication Dose Route Frequency Provider Last Rate Last Dose  . apixaban (ELIQUIS) tablet 5 mg  5 mg Oral BID Rosalin Hawking, MD        Allergies  Allergen Reactions  . Latex Other (See Comments)    Reaction??  . Xarelto [Rivaroxaban] Other (See Comments)    Made patient "not feel like herself"    Social History   Social History  . Marital status: Divorced    Spouse name: N/A  . Number of children: N/A  . Years of education: N/A   Occupational History  . Not on file.   Social  History Main Topics  . Smoking status: Former Smoker    Quit date: 10/21/2013  . Smokeless tobacco: Current User     Comment: Quit 2 years ago - uses Secondary school teacher  . Alcohol use No  . Drug use: No  . Sexual activity: Not on file   Other Topics Concern  . Not on file   Social History Narrative  . No narrative on file    No family history on file.  ROS- All systems are reviewed and negative except as per the HPI above  Physical Exam: Vitals:   05/26/16 0915  BP: (!) 158/82  Pulse: 99  Weight: 158 lb 6.4 oz (71.8 kg)  Height: 5\' 2"  (1.575 m)   Wt Readings from Last 3 Encounters:  05/26/16 158 lb 6.4 oz (71.8 kg)  05/05/16 156 lb 1.4 oz (70.8 kg)  10/22/15 160 lb (72.6 kg)    Labs: Lab Results  Component Value Date   NA 144 05/06/2016   K 3.6 05/06/2016   CL 111 05/06/2016   CO2 23 05/06/2016   GLUCOSE 124 (H) 05/06/2016   BUN 8 05/06/2016   CREATININE 0.64 05/06/2016   CALCIUM 6.7 (L) 05/06/2016   PHOS 4.3 05/06/2016   MG 1.8 05/06/2016   Lab Results  Component Value Date   INR 1.05 05/04/2016   Lab Results  Component Value Date   CHOL 77 05/05/2016   HDL 30 (L) 05/05/2016   LDLCALC 32 05/05/2016   TRIG 77 05/05/2016     GEN- The patient is well appearing, alert and oriented x 3 today.   Head- normocephalic, atraumatic Eyes-  Sclera clear, conjunctiva pink Ears- hearing intact Oropharynx- clear Neck- supple, no JVP Lymph- no cervical lymphadenopathy Lungs- Clear to ausculation bilaterally, normal work of breathing Heart-irregular rate and rhythm, no murmurs, rubs or gallops, PMI not laterally displaced GI- soft, NT, ND, + BS Extremities- no clubbing, cyanosis, or edema MS- no significant deformity or atrophy Skin- no rash or lesion Psych- euthymic mood, full affect Neuro- strength and sensation are intact  EKG-afib at 99 bpm, qrs int 86 bpm, qtc 420 ms  Epic records reviewed PCP notes from 12/22 reviewed    Assessment and Plan: 1. Persistent  afib with recent CVA Pending CT of the head 12/29 and if no significant bleeding pt will start DOAC This was supposed to have been scheduled earlier and pt start Doac on 12/22 but apparently there was a delay with insurance and scheduling and is now pending Friday She was encouraged to increase ASA to 325 mg until test She is barely rate controlled in the office today and I have asked her to check BP/HR 2x's aday for the next several days  I will see her back on Monday and make sure she is on DOAC if CT negative for cerebral bleed and to review readings from home to ascertain good rate control She is asymptomatic in afib so I anticipate rate control going forward.  Geroge Baseman [place], [facility] [address] [place], [place] [address] [phone]

## 2016-05-27 ENCOUNTER — Telehealth (HOSPITAL_COMMUNITY): Payer: Self-pay

## 2016-05-27 NOTE — Telephone Encounter (Signed)
Called to schedule, left vm. AW 

## 2016-05-28 ENCOUNTER — Ambulatory Visit
Admission: RE | Admit: 2016-05-28 | Discharge: 2016-05-28 | Disposition: A | Discharge status: Home / Self Care | Payer: PPO | Source: Ambulatory Visit | Attending: Neurology | Admitting: Neurology

## 2016-05-28 DIAGNOSIS — I6300 Cerebral infarction due to thrombosis of unspecified precerebral artery: Secondary | ICD-10-CM

## 2016-05-28 DIAGNOSIS — I639 Cerebral infarction, unspecified: Secondary | ICD-10-CM | POA: Diagnosis not present

## 2016-06-01 ENCOUNTER — Encounter (HOSPITAL_COMMUNITY): Payer: Self-pay | Admitting: Nurse Practitioner

## 2016-06-01 ENCOUNTER — Ambulatory Visit (HOSPITAL_COMMUNITY)
Admission: RE | Admit: 2016-06-01 | Discharge: 2016-06-01 | Disposition: A | Discharge status: Home / Self Care | Payer: PPO | Source: Ambulatory Visit | Attending: Nurse Practitioner | Admitting: Nurse Practitioner

## 2016-06-01 DIAGNOSIS — E119 Type 2 diabetes mellitus without complications: Secondary | ICD-10-CM | POA: Diagnosis not present

## 2016-06-01 DIAGNOSIS — I6300 Cerebral infarction due to thrombosis of unspecified precerebral artery: Secondary | ICD-10-CM | POA: Diagnosis not present

## 2016-06-01 DIAGNOSIS — I10 Essential (primary) hypertension: Secondary | ICD-10-CM | POA: Diagnosis not present

## 2016-06-01 DIAGNOSIS — I481 Persistent atrial fibrillation: Secondary | ICD-10-CM | POA: Diagnosis not present

## 2016-06-01 DIAGNOSIS — Z87891 Personal history of nicotine dependence: Secondary | ICD-10-CM | POA: Insufficient documentation

## 2016-06-01 DIAGNOSIS — I4891 Unspecified atrial fibrillation: Secondary | ICD-10-CM | POA: Diagnosis not present

## 2016-06-01 DIAGNOSIS — Z8673 Personal history of transient ischemic attack (TIA), and cerebral infarction without residual deficits: Secondary | ICD-10-CM | POA: Insufficient documentation

## 2016-06-01 MED ORDER — APIXABAN 5 MG PO TABS: 5.0000 mg | ORAL_TABLET | Freq: Two times a day (BID) | ORAL | 6 refills | Status: DC

## 2016-06-01 NOTE — Patient Instructions (Signed)
Your physician has recommended you make the following change in your medication:  1)Eliquis 5mg  twice a day 2)Stop aspirin

## 2016-06-01 NOTE — Progress Notes (Signed)
Primary Care Physician: Philis Fendt, MD Referring Physician: [facility] f/u   Diana Cervantes is a 76 y.o. female that in the afib clinic for f/u of recent CVA and persistent atrial fibrillation. Pt was first diagnosed in May of this year after presenting with an URI. She was given RX for  xarelto and cardizem and asked to f/u in the afib clinic. Several attempts were made to contact pt as well as messages left on machine and letters were sent and she never followed up. She also did not start her meds.  She then presented to [facility] 12/5 after she fell in the bathroom and her son found her with severe right hemiparesis and aphagia.She arrived via EMS as a code stroke. CT angiogram demonstrated a left MCA occlusion. SHe was not a TPA candidate due to anticoagulation which was listed on the chart but pt had never started. She went to IR and received TICI revascularization with Soltaire and Integrelin.  In the clinic, pt does not appear to have any neuro deficients.She however was not started on DOAC due to small amount of hemorrhage associated with infarct. She was discharged on 325 mg of ASA which pt decreased on her onw to 81 mg of asa a day due to thinking it made her BP higher and was scheduled for a repeat CT of the brain to make sure bleeding was resolved. However, becAUSE of insurance issues the CT of the head  was delayed and is not scheduled until Friday  After review, per pt and son, they will be notified based on results to start [place]. She continues to be in asymptomatic afib.   F/u 1/2. She had CT on Friday but not not been called results but shows the hemorrhage to be resolved so pt can start Eliquis and stop ASA. She still states that afib is not bothering her.  V rates are controlled today by EKG and her V/S  recorded at home show v rates in the 80's.  Today, she denies symptoms of palpitations, chest pain, shortness of breath, orthopnea, PND, lower extremity edema, dizziness, presyncope,  syncope, or neurologic sequela. The patient is tolerating medications without difficulties and is otherwise without complaint today.   Past Medical History:  Diagnosis Date  . Cataract   . Diabetes mellitus without complication ([place])   . Hypertension    Past Surgical History:  Procedure Laterality Date  . ABDOMINAL HYSTERECTOMY    . CHOLECYSTECTOMY    . IR GENERIC HISTORICAL  05/04/2016   IR ANGIO INTRA EXTRACRAN SEL COM CAROTID INNOMINATE UNI R MOD SED 05/04/2016 Luanne Bras, MD [facility]  . IR GENERIC HISTORICAL  05/04/2016   IR ANGIO VERTEBRAL SEL VERTEBRAL UNI L MOD SED 05/04/2016 Luanne Bras, MD [facility]  . IR GENERIC HISTORICAL  05/04/2016   IR PERCUTANEOUS ART THROMBECTOMY/INFUSION INTRACRANIAL INC DIAG ANGIO 05/04/2016 Luanne Bras, MD [facility]  . RADIOLOGY WITH ANESTHESIA N/A 05/04/2016   Procedure: RADIOLOGY WITH ANESTHESIA;  Surgeon: Luanne Bras, MD;  Location: [place];  Service: Radiology;  Laterality: N/A;    Current Outpatient Prescriptions  Medication Sig Dispense Refill  . B Complex-C (SUPER B COMPLEX PO) Take 1 tablet by mouth daily.     Marland Kitchen CALCIUM PO Take 1 tablet by mouth daily.     . Cholecalciferol (VITAMIN D-3) 1000 units CAPS Take 1,000 Units by mouth daily.     Marland Kitchen diltiazem (CARDIZEM CD) 120 MG 24 hr capsule Take 120 mg by mouth daily.    Marland Kitchen  linagliptin (TRADJENTA) 5 MG TABS tablet Take 5 mg by mouth daily.    . Probiotic Product (PROBIOTIC DAILY PO) Take 1 tablet by mouth daily.     . rosuvastatin (CRESTOR) 10 MG tablet Take 10 mg by mouth daily.    Marland Kitchen apixaban (ELIQUIS) 5 MG TABS tablet Take 1 tablet (5 mg total) by mouth 2 (two) times daily. 60 tablet 6   No current facility-administered medications for this encounter.     Allergies  Allergen Reactions  . Latex Other (See Comments)    Reaction??  . Xarelto [Rivaroxaban] Other (See Comments)    Made patient "not feel like herself"    Social History   Social History  .  Marital status: Divorced    Spouse name: N/A  . Number of children: N/A  . Years of education: N/A   Occupational History  . Not on file.   Social History Main Topics  . Smoking status: Former Smoker    Quit date: 10/21/2013  . Smokeless tobacco: Current User     Comment: Quit 2 years ago - uses Secondary school teacher  . Alcohol use No  . Drug use: No  . Sexual activity: Not on file   Other Topics Concern  . Not on file   Social History Narrative  . No narrative on file    No family history on file.  ROS- All systems are reviewed and negative except as per the HPI above  Physical Exam: Vitals:   06/01/16 1004  BP: 138/78  Pulse: 82  Weight: 159 lb 9.6 oz (72.4 kg)  Height: 5\' 2"  (1.575 m)   Wt Readings from Last 3 Encounters:  06/01/16 159 lb 9.6 oz (72.4 kg)  05/26/16 158 lb 6.4 oz (71.8 kg)  05/05/16 156 lb 1.4 oz (70.8 kg)    Labs: Lab Results  Component Value Date   NA 144 05/06/2016   K 3.6 05/06/2016   CL 111 05/06/2016   CO2 23 05/06/2016   GLUCOSE 124 (H) 05/06/2016   BUN 8 05/06/2016   CREATININE 0.64 05/06/2016   CALCIUM 6.7 (L) 05/06/2016   PHOS 4.3 05/06/2016   MG 1.8 05/06/2016   Lab Results  Component Value Date   INR 1.05 05/04/2016   Lab Results  Component Value Date   CHOL 77 05/05/2016   HDL 30 (L) 05/05/2016   LDLCALC 32 05/05/2016   TRIG 77 05/05/2016     GEN- The patient is well appearing, alert and oriented x 3 today.   Head- normocephalic, atraumatic Eyes-  Sclera clear, conjunctiva pink Ears- hearing intact Oropharynx- clear Neck- supple, no JVP Lymph- no cervical lymphadenopathy Lungs- Clear to ausculation bilaterally, normal work of breathing Heart-irregular rate and rhythm, no murmurs, rubs or gallops, PMI not laterally displaced GI- soft, NT, ND, + BS Extremities- no clubbing, cyanosis, or edema MS- no significant deformity or atrophy Skin- no rash or lesion Psych- euthymic mood, full affect Neuro- strength and sensation  are intact  EKG-afib at 82 bpm, qrs int 84 bpm, qtc 450 ms  Epic records reviewed PCP notes from 12/22 reviewed CT of head 12/29-IMPRESSION: 1. Expected evolution of subacute left MCA distribution infarcts. Mass effect and petechial hemorrhagic density have resolved. 2. Remote bilateral cerebral infarcts. No acute or interval infarct.    Assessment and Plan: 1. Persistent afib with recent CVA  CT of the head 12/29 showed hemorrhage to be resolved.  Ok to start DOAC, stop asa This was supposed to have been scheduled earlier  and pt start Doac on 12/22 but apparently there was a delay with insurance and scheduling  She is rate controlled in the office today and  her  BP/HR 2x's aday for the last several days show rate control with V rates in the 80's  F/u in afib clinic in 3 months She is asymptomatic in afib so I anticipate rate control going forward.  Geroge Baseman Taiten Brawn, [facility] [address] [place], [place] [address] [phone]

## 2016-06-02 ENCOUNTER — Telehealth: Payer: Self-pay | Admitting: Neurology

## 2016-06-02 NOTE — Telephone Encounter (Signed)
Rn call patient back with Dr.Xu recommendations. He wants her to stop aspirin and start the eliquis. He wants the pt to follow up as required with the [place] atrial fibrillation clinic for refills and management. Pt verbalized dosage and understands.

## 2016-06-02 NOTE — Telephone Encounter (Signed)
Rn call patient about her eliquis medication. Rn stated to patient per epic notes she was just seen by Roderic Palau NP at the atrial fibrillation clinic yesterday for a office visit. Rn also stated Dr. Erlinda Hong did not prescribed the eliquis. Pt stated Dr. Erlinda Hong prescribed the eliquis. Rn ask if she was send yesterday at [facility] clinic. Pt stated she was seen by the office and eliquis was prescribed by them. Pt stated Dr. Erlinda Hong left her a vm to call our office. Rn stated a message will be sent to Dr. Erlinda Hong about the eliquis medication.

## 2016-06-02 NOTE — Telephone Encounter (Signed)
Rn spoke with Dr. Erlinda Hong via phone that pt is calling about eliquis medication directions. The medication was prescribed by the [facility] atrial fibrillation clinic yesterday and she had a office visit on that day too. Dr. Erlinda Hong wants patient to resume it. Dr. Erlinda Hong will contact patient about the eliquis.

## 2016-06-02 NOTE — Telephone Encounter (Signed)
Patient had seen Dr. Erlinda Hong in the hospital and was discharged 05-07-16. The patient would like directions for taking apixaban (ELIQUIS) 5 MG TABS tablet.

## 2016-06-06 DIAGNOSIS — N390 Urinary tract infection, site not specified: Secondary | ICD-10-CM | POA: Diagnosis not present

## 2016-06-06 DIAGNOSIS — R3989 Other symptoms and signs involving the genitourinary system: Secondary | ICD-10-CM | POA: Diagnosis not present

## 2016-06-06 DIAGNOSIS — E784 Other hyperlipidemia: Secondary | ICD-10-CM | POA: Diagnosis not present

## 2016-06-06 DIAGNOSIS — I480 Paroxysmal atrial fibrillation: Secondary | ICD-10-CM | POA: Diagnosis not present

## 2016-06-06 DIAGNOSIS — I629 Nontraumatic intracranial hemorrhage, unspecified: Secondary | ICD-10-CM | POA: Diagnosis not present

## 2016-06-06 DIAGNOSIS — E119 Type 2 diabetes mellitus without complications: Secondary | ICD-10-CM | POA: Diagnosis not present

## 2016-06-06 DIAGNOSIS — I10 Essential (primary) hypertension: Secondary | ICD-10-CM | POA: Diagnosis not present

## 2016-06-06 DIAGNOSIS — Z6828 Body mass index (BMI) 28.0-28.9, adult: Secondary | ICD-10-CM | POA: Diagnosis not present

## 2016-06-08 ENCOUNTER — Other Ambulatory Visit (HOSPITAL_COMMUNITY): Payer: Self-pay | Admitting: Interventional Radiology

## 2016-06-08 DIAGNOSIS — I639 Cerebral infarction, unspecified: Secondary | ICD-10-CM

## 2016-06-22 ENCOUNTER — Encounter (HOSPITAL_COMMUNITY): Payer: Self-pay | Admitting: Radiology

## 2016-06-22 ENCOUNTER — Ambulatory Visit (HOSPITAL_COMMUNITY)
Admission: RE | Admit: 2016-06-22 | Discharge: 2016-06-22 | Disposition: A | Discharge status: Home / Self Care | Payer: PPO | Source: Ambulatory Visit | Attending: Interventional Radiology | Admitting: Interventional Radiology

## 2016-06-22 DIAGNOSIS — I63412 Cerebral infarction due to embolism of left middle cerebral artery: Secondary | ICD-10-CM | POA: Diagnosis not present

## 2016-06-22 DIAGNOSIS — I639 Cerebral infarction, unspecified: Secondary | ICD-10-CM

## 2016-06-22 DIAGNOSIS — R531 Weakness: Secondary | ICD-10-CM | POA: Diagnosis not present

## 2016-06-22 HISTORY — PX: IR GENERIC HISTORICAL: IMG1180011

## 2016-06-24 ENCOUNTER — Encounter (HOSPITAL_COMMUNITY): Payer: Self-pay | Admitting: Nurse Practitioner

## 2016-06-24 ENCOUNTER — Ambulatory Visit (HOSPITAL_COMMUNITY)
Admission: RE | Admit: 2016-06-24 | Discharge: 2016-06-24 | Disposition: A | Discharge status: Home / Self Care | Payer: PPO | Source: Ambulatory Visit | Attending: Nurse Practitioner | Admitting: Nurse Practitioner

## 2016-06-24 VITALS — BP 150/84 | HR 77 | Ht 62.0 in | Wt 158.4 lb

## 2016-06-24 DIAGNOSIS — F17290 Nicotine dependence, other tobacco product, uncomplicated: Secondary | ICD-10-CM | POA: Insufficient documentation

## 2016-06-24 DIAGNOSIS — E119 Type 2 diabetes mellitus without complications: Secondary | ICD-10-CM | POA: Insufficient documentation

## 2016-06-24 DIAGNOSIS — Z8673 Personal history of transient ischemic attack (TIA), and cerebral infarction without residual deficits: Secondary | ICD-10-CM | POA: Diagnosis not present

## 2016-06-24 DIAGNOSIS — I481 Persistent atrial fibrillation: Secondary | ICD-10-CM | POA: Diagnosis not present

## 2016-06-24 DIAGNOSIS — I4891 Unspecified atrial fibrillation: Secondary | ICD-10-CM | POA: Diagnosis not present

## 2016-06-24 DIAGNOSIS — I10 Essential (primary) hypertension: Secondary | ICD-10-CM | POA: Diagnosis not present

## 2016-06-24 DIAGNOSIS — I4819 Other persistent atrial fibrillation: Secondary | ICD-10-CM

## 2016-06-24 LAB — CBC
HCT: 37 % (ref 36.0–46.0)
Hemoglobin: 11.6 g/dL — ABNORMAL LOW (ref 12.0–15.0)
MCH: 24.4 pg — AB (ref 26.0–34.0)
MCHC: 31.4 g/dL (ref 30.0–36.0)
MCV: 77.7 fL — ABNORMAL LOW (ref 78.0–100.0)
PLATELETS: 223 10*3/uL (ref 150–400)
RBC: 4.76 MIL/uL (ref 3.87–5.11)
RDW: 15.1 % (ref 11.5–15.5)
WBC: 8.5 10*3/uL (ref 4.0–10.5)

## 2016-06-24 NOTE — Progress Notes (Signed)
error 

## 2016-06-24 NOTE — Progress Notes (Signed)
Primary Care Physician: Philis Fendt, MD Referring Physician: [facility] f/u   Diana Cervantes is a 76 y.o. female that in the afib clinic for f/u of recent CVA and persistent atrial fibrillation. Pt was first diagnosed in May of this year after presenting with an URI. She was given RX for  xarelto and cardizem and asked to f/u in the afib clinic. Several attempts were made to contact pt as well as messages left on machine and letters were sent and she never followed up. She also did not start her meds.  She then presented to [facility] 12/5 after she fell in the bathroom and her son found her with severe right hemiparesis and aphagia.She arrived via EMS as a code stroke. CT angiogram demonstrated a left MCA occlusion. SHe was not a TPA candidate due to anticoagulation which was listed on the chart but pt had never started. She went to IR and received TICI revascularization with Soltaire and Integrelin.  In the clinic, pt does not appear to have any neuro deficients.She however was not started on DOAC due to small amount of hemorrhage associated with infarct. She was discharged on 325 mg of ASA which pt decreased on her onw to 81 mg of asa a day due to thinking it made her BP higher and was scheduled for a repeat CT of the brain to make sure bleeding was resolved. However, becAUSE of insurance issues the CT of the head  was delayed and is not scheduled until Friday  After review, per pt and son, they will be notified based on results to start [place]. She continues to be in asymptomatic afib.   F/u 1/2. She had CT on Friday but not not been called results but shows the hemorrhage to be resolved so pt can start Eliquis and stop ASA. She still states that afib is not bothering her.  V rates are controlled today by EKG and her V/S  recorded at home show v rates in the 80's.  Returns to afib clinic 1/25 and has been on DOAC x 3 weeks and is tolerating well. No neuro symptoms. Minimally aware of afib. Can do her  daily activities without undue fatigue or shortness of breath. Well rate controlled.  Today, she denies symptoms of palpitations, chest pain, shortness of breath, orthopnea, PND, lower extremity edema, dizziness, presyncope, syncope, or neurologic sequela. The patient is tolerating medications without difficulties and is otherwise without complaint today.   Past Medical History:  Diagnosis Date  . Cataract   . Diabetes mellitus without complication ([place])   . Hypertension    Past Surgical History:  Procedure Laterality Date  . ABDOMINAL HYSTERECTOMY    . CHOLECYSTECTOMY    . IR GENERIC HISTORICAL  05/04/2016   IR ANGIO INTRA EXTRACRAN SEL COM CAROTID INNOMINATE UNI R MOD SED 05/04/2016 Luanne Bras, MD [facility]  . IR GENERIC HISTORICAL  05/04/2016   IR ANGIO VERTEBRAL SEL VERTEBRAL UNI L MOD SED 05/04/2016 Luanne Bras, MD [facility]  . IR GENERIC HISTORICAL  05/04/2016   IR PERCUTANEOUS ART THROMBECTOMY/INFUSION INTRACRANIAL INC DIAG ANGIO 05/04/2016 Luanne Bras, MD [facility]  . IR GENERIC HISTORICAL  06/22/2016   IR RADIOLOGIST EVAL & MGMT 06/22/2016 [facility]  . RADIOLOGY WITH ANESTHESIA N/A 05/04/2016   Procedure: RADIOLOGY WITH ANESTHESIA;  Surgeon: Luanne Bras, MD;  Location: [place];  Service: Radiology;  Laterality: N/A;    Current Outpatient Prescriptions  Medication Sig Dispense Refill  . apixaban (ELIQUIS) 5 MG  TABS tablet Take 1 tablet (5 mg total) by mouth 2 (two) times daily. 60 tablet 6  . B Complex-C (SUPER B COMPLEX PO) Take 1 tablet by mouth daily.     Marland Kitchen CALCIUM PO Take 1 tablet by mouth daily.     . Cholecalciferol (VITAMIN D-3) 1000 units CAPS Take 1,000 Units by mouth daily.     Marland Kitchen diltiazem (CARDIZEM CD) 120 MG 24 hr capsule Take 120 mg by mouth daily.    Marland Kitchen linagliptin (TRADJENTA) 5 MG TABS tablet Take 5 mg by mouth daily.    . Probiotic Product (PROBIOTIC DAILY PO) Take 1 tablet by mouth daily.     . rosuvastatin (CRESTOR) 10 MG  tablet Take 10 mg by mouth daily.     No current facility-administered medications for this encounter.     Allergies  Allergen Reactions  . Latex Other (See Comments)    Reaction??  . Xarelto [Rivaroxaban] Other (See Comments)    Made patient "not feel like herself"    Social History   Social History  . Marital status: Divorced    Spouse name: N/A  . Number of children: N/A  . Years of education: N/A   Occupational History  . Not on file.   Social History Main Topics  . Smoking status: Former Smoker    Quit date: 10/21/2013  . Smokeless tobacco: Current User     Comment: Quit 2 years ago - uses Secondary school teacher  . Alcohol use No  . Drug use: No  . Sexual activity: Not on file   Other Topics Concern  . Not on file   Social History Narrative  . No narrative on file    No family history on file.  ROS- All systems are reviewed and negative except as per the HPI above  Physical Exam: Vitals:   06/24/16 1030  BP: (!) 150/84  Pulse: 77  Weight: 158 lb 6.4 oz (71.8 kg)  Height: 5\' 2"  (1.575 m)   Wt Readings from Last 3 Encounters:  06/24/16 158 lb 6.4 oz (71.8 kg)  06/01/16 159 lb 9.6 oz (72.4 kg)  05/26/16 158 lb 6.4 oz (71.8 kg)    Labs: Lab Results  Component Value Date   NA 144 05/06/2016   K 3.6 05/06/2016   CL 111 05/06/2016   CO2 23 05/06/2016   GLUCOSE 124 (H) 05/06/2016   BUN 8 05/06/2016   CREATININE 0.64 05/06/2016   CALCIUM 6.7 (L) 05/06/2016   PHOS 4.3 05/06/2016   MG 1.8 05/06/2016   Lab Results  Component Value Date   INR 1.05 05/04/2016   Lab Results  Component Value Date   CHOL 77 05/05/2016   HDL 30 (L) 05/05/2016   LDLCALC 32 05/05/2016   TRIG 77 05/05/2016     GEN- The patient is well appearing, alert and oriented x 3 today.   Head- normocephalic, atraumatic Eyes-  Sclera clear, conjunctiva pink Ears- hearing intact Oropharynx- clear Neck- supple, no JVP Lymph- no cervical lymphadenopathy Lungs- Clear to ausculation  bilaterally, normal work of breathing Heart- irregular rate and rhythm, no murmurs, rubs or gallops, PMI not laterally displaced GI- soft, NT, ND, + BS Extremities- no clubbing, cyanosis, or edema MS- no significant deformity or atrophy Skin- no rash or lesion Psych- euthymic mood, full affect Neuro- strength and sensation are intact  EKG- afib at 77 bpm, qrs int 80 ms, qtc 470 ms Epic records reviewed PCP notes from 12/22 reviewed CT of head 12/29-IMPRESSION: 1. Expected  evolution of subacute left MCA distribution infarcts. Mass effect and petechial hemorrhagic density have resolved. 2. Remote bilateral cerebral infarcts. No acute or interval infarct.    Assessment and Plan:  1. Persistent afib with recent CVA  CT of the head 12/29 showed hemorrhage to be resolved. DOAC started x last 3 weeks, no bleeding, off asa She is rate controlled in the office today with V rates in the 80's CBC today  F/u in afib clinic in 3 months She is  minimally symptomatic in afib so I anticipate rate control going forward, but if becomes symptomatic can discuss rhythm control  F/u with Dr. Erlinda Hong 2/21  Geroge Baseman. Chaniyah Jahr, [facility] [address] [place],  [address] [phone]

## 2016-06-25 ENCOUNTER — Encounter (HOSPITAL_COMMUNITY): Payer: Self-pay | Admitting: Interventional Radiology

## 2016-07-14 DIAGNOSIS — E119 Type 2 diabetes mellitus without complications: Secondary | ICD-10-CM | POA: Diagnosis not present

## 2016-07-14 DIAGNOSIS — I10 Essential (primary) hypertension: Secondary | ICD-10-CM | POA: Diagnosis not present

## 2016-07-14 DIAGNOSIS — I629 Nontraumatic intracranial hemorrhage, unspecified: Secondary | ICD-10-CM | POA: Diagnosis not present

## 2016-07-14 DIAGNOSIS — I480 Paroxysmal atrial fibrillation: Secondary | ICD-10-CM | POA: Diagnosis not present

## 2016-07-14 DIAGNOSIS — Z1389 Encounter for screening for other disorder: Secondary | ICD-10-CM | POA: Diagnosis not present

## 2016-07-14 DIAGNOSIS — E784 Other hyperlipidemia: Secondary | ICD-10-CM | POA: Diagnosis not present

## 2016-07-14 DIAGNOSIS — Z6828 Body mass index (BMI) 28.0-28.9, adult: Secondary | ICD-10-CM | POA: Diagnosis not present

## 2016-07-21 ENCOUNTER — Encounter: Payer: Self-pay | Admitting: Neurology

## 2016-07-21 ENCOUNTER — Ambulatory Visit (INDEPENDENT_AMBULATORY_CARE_PROVIDER_SITE_OTHER): Payer: PPO | Admitting: Neurology

## 2016-07-21 VITALS — BP 150/75 | HR 96 | Ht 62.0 in | Wt 156.6 lb

## 2016-07-21 DIAGNOSIS — I482 Chronic atrial fibrillation, unspecified: Secondary | ICD-10-CM

## 2016-07-21 DIAGNOSIS — I10 Essential (primary) hypertension: Secondary | ICD-10-CM | POA: Diagnosis not present

## 2016-07-21 DIAGNOSIS — I63312 Cerebral infarction due to thrombosis of left middle cerebral artery: Secondary | ICD-10-CM

## 2016-07-21 DIAGNOSIS — Z7901 Long term (current) use of anticoagulants: Secondary | ICD-10-CM

## 2016-07-21 NOTE — Patient Instructions (Signed)
-   continue eliquis and crestor for stroke prevention - resume BP medication, able to take tylenol for pain as needed - Follow up with your primary care physician for stroke risk factor modification. Recommend maintain blood pressure goal <130/80, diabetes with hemoglobin A1c goal below 7.0% and lipids with LDL cholesterol goal below 70 mg/dL.  - check BP at home and record - follow up with cardiology as scheduled.  - able to back to work from stroke standpoint, recommend part time for 1-2 weeks before back to full schedule. - healthy diet and regular exercise.  - follow up in 4 months

## 2016-07-22 DIAGNOSIS — Z7901 Long term (current) use of anticoagulants: Secondary | ICD-10-CM | POA: Insufficient documentation

## 2016-07-22 DIAGNOSIS — I482 Chronic atrial fibrillation, unspecified: Secondary | ICD-10-CM | POA: Insufficient documentation

## 2016-07-22 DIAGNOSIS — I10 Essential (primary) hypertension: Secondary | ICD-10-CM | POA: Insufficient documentation

## 2016-07-22 NOTE — Progress Notes (Signed)
STROKE NEUROLOGY FOLLOW UP NOTE  NAME: [name] DOB: 12/21/1940  REASON FOR VISIT: stroke follow up HISTORY FROM: pt and chart  Today we had the pleasure of seeing Diana Cervantes in follow-up at our Neurology Clinic. Pt was accompanied by son.   History Summary [name] a 76 y.o.femalewith history of atrial fibrillation admitted on 05/04/16 found down with R hemiparesis and aphasia.Shedidnot receive IV t-PA due to being on Xarelto.CT hyperdense L MCA, old right parietal and left CR infarct. CTA head and neck showed L MCA occlusion. Taken to IR where she received TICI 3 revascularization.MRI showed left caudate and putamen hemorrhagic infarcts, and left frontal and insular small infarcts. MRA unremarkable after IR. EF 55-60%. LDL 32 and A1C 6.5. Put on ASA. Crestor resumed. Anticoagulation was on hold on discharge.   Interval History During the interval time, the patient has been doing well. Repeat CT head on 05/28/16 showed resolution of hemorrhage and her ASA changed to eliquis 5mg  bid. No side effects so far. However, her BP still not in good control and her BP meds have not been restarted yet. Today BP 150/75.   REVIEW OF SYSTEMS: Full 14 system review of systems performed and notable only for those listed below and in HPI above, all others are negative:  Constitutional:   Cardiovascular: leg swelling Ear/Nose/Throat:   Skin:  Eyes:   Respiratory:   Gastroitestinal:   Genitourinary:  Hematology/Lymphatic:   Endocrine:  Musculoskeletal:   Allergy/Immunology:   Neurological:   Psychiatric:  Sleep: insomnia  The following represents the patient's updated allergies and side effects list: Allergies  Allergen Reactions  . Latex Other (See Comments)    Reaction??  . Xarelto [Rivaroxaban] Other (See Comments)    Made patient "not feel like herself"    The neurologically relevant items on the patient's problem list were reviewed on today's  visit.  Neurologic Examination  A problem focused neurological exam (12 or more points of the single system neurologic examination, vital signs counts as 1 point, cranial nerves count for 8 points) was performed.  Blood pressure (!) 150/75, pulse 96, height 5\' 2"  (1.575 m), weight 156 lb 9.6 oz (71 kg).  General - Well nourished, well developed, in no apparent distress.  Ophthalmologic - Fundi not visualized due to noncooperation.  Cardiovascular - irregularly irregular heart rate and rhythm.  Mental Status -  Level of arousal and orientation to time, place, and person were intact. Language including expression, naming, repetition, comprehension was assessed and found intact. Fund of Knowledge was assessed and was intact.  Cranial Nerves II - XII - II - Visual field intact OU. III, IV, VI - Extraocular movements intact. V - Facial sensation intact bilaterally. VII - Facial movement intact bilaterally. VIII - Hearing & vestibular intact bilaterally. X - Palate elevates symmetrically. XI - Chin turning & shoulder shrug intact bilaterally. XII - Tongue protrusion intact.  Motor Strength - The patient's strength was normal in all extremities and pronator drift was absent.  Bulk was normal and fasciculations were absent.   Motor Tone - Muscle tone was assessed at the neck and appendages and was normal.  Reflexes - The patient's reflexes were 1+ in all extremities and she had no pathological reflexes.  Sensory - Light touch, temperature/pinprick were assessed and were normal.    Coordination - The patient had normal movements in the hands and feet with no ataxia or dysmetria.  Tremor was absent.  Gait and Station -  The patient's transfers, posture, gait, station, and turns were observed as normal.   Functional score  mRS = 0   0 - No symptoms.   1 - No significant disability. Able to carry out all usual activities, despite some symptoms.   2 - Slight disability. Able to  look after own affairs without assistance, but unable to carry out all previous activities.   3 - Moderate disability. Requires some help, but able to walk unassisted.   4 - Moderately severe disability. Unable to attend to own bodily needs without assistance, and unable to walk unassisted.   5 - Severe disability. Requires constant nursing care and attention, bedridden, incontinent.   6 - Dead.   NIH Stroke Scale = 0   Data reviewed: I personally reviewed the images and agree with the radiology interpretations.  Ct Head Wo Contrast 05/08/2016 Acute infarct left basal ganglia unchanged in distribution from the recent MRI 05/05/2016.  Small amount of associated hemorrhage also similar to the MRI.  No new area of hemorrhage or infarction.  Ct Angio Head and neck W Or Wo Contrast 05/04/2016 Occlusion left M1 segment with hypoperfusion throughout the left MCA territory. No other intracranial stenosis Mild atherosclerotic disease in the carotid bifurcation bilaterally without significant carotid stenosis.    Ct Head Wo Contrast 05/04/2016 Hyperdense material seen within the superior aspect of the left major fissure. This may represent contrast or blood from recent intervention. Posterior aspect of the left lenticular nucleus was previously noted to be slightly hypodense which may indicate acute infarct. This area now demonstrates mild enhancement post angiography. Remote left frontal lobe and posterior right temporal -parietal lobe infarcts. Mild global atrophy without hydrocephalus. Hyperdense left middle cerebral artery M1 segment once again noted which may indicate presence of residual thrombus.  Ct Cervical Spine Wo Contrast 05/04/2016 Hyperdense left MCA worrisome for thrombus. Lack of visualization of a parenchymal infarct may be due to hyperacuity. ASPECTS score is 10/10. Remote posterior right MCA territory infarcted deep white matter infarct in the left frontal lobe.  Atherosclerosis. C6-7 degenerative disc disease.   Cerebral Angiogram 04/04/2016 1Occluded LT MCA prox with Complete revascularization with x 1 pass with the 79mm x 40 mm Solitaire FR retrieval device and 3.6 mg of superselective intracranial Integrelin achieving a TICI 3 reperfusion  Mr Brain Wo Contrast Mr Jodene Nam Head Wo Contrast 05/05/2016 Acute hemorrhagic infarct left caudate and left putamen. Small areas of acute infarct also in the left frontal temporal lobe and left insula. Chronic infarct right parietal lobe Successful re- canalization of left M1 occlusion. Left in middle cerebral artery branches are now widely patent on MRA.   TTE - Left ventricle: The cavity size was normal. Wall thickness wasincreased in a pattern of mild LVH. Systolic function was normal.The estimated ejection fraction was in the range of 55% to 60%.Wall motion was normal; there were no regional wall motionabnormalities. - Aortic valve: Mildly calcified annulus. - Mitral valve: There was mild regurgitation. - Left atrium: The atrium was mildly dilated.  CT head 05/28/16 1. Expected evolution of subacute left MCA distribution infarcts. Mass effect and petechial hemorrhagic density have resolved. 2. Remote bilateral cerebral infarcts. No acute or interval infarct.  Component     Latest Ref Rng & Units 05/05/2016  Cholesterol     0 - 200 mg/dL 77  Triglycerides     <150 mg/dL 77  HDL Cholesterol     >40 mg/dL 30 (L)  Total CHOL/HDL Ratio  RATIO 2.6  VLDL     0 - 40 mg/dL 15  LDL (calc)     0 - 99 mg/dL 32  Hemoglobin A1C     4.8 - 5.6 % 6.5 (H)  Mean Plasma Glucose     mg/dL 140    Assessment: As you may recall, she is a 76 y.o. Caucasian female with PMH of afib on Xarelto admitted on 05/04/16 found down with R hemiparesis and aphasia.Shedidnot receive IV t-PA due to being on Xarelto.CT hyperdense L MCA, old right parietal and left CR infarct. CTA head and neck showed L MCA occlusion. Taken  to IR where she received TICI 3 revascularization.MRI showed left caudate and putamen hemorrhagic infarcts, and left frontal and insular small infarcts. MRA unremarkable after IR. EF 55-60%. LDL 32 and A1C 6.5. Put on ASA. Crestor resumed. Eliquis started after repeat CT head on 05/28/16 showed resolution of hemorrhage. Neuro deficit resolved. Her BP still not in good control and her BP meds have not been restarted yet.   Plan:  - continue eliquis and crestor for stroke prevention - resume BP medication, able to take tylenol for pain as needed - Follow up with your primary care physician for stroke risk factor modification. Recommend maintain blood pressure goal <130/80, diabetes with hemoglobin A1c goal below 7.0% and lipids with LDL cholesterol goal below 70 mg/dL.  - check BP at home and record - follow up with cardiology as scheduled.  - able to back to work from stroke standpoint, recommend part time for 1-2 weeks before back to full schedule. - healthy diet and regular exercise.  - follow up in 4 months  I spent more than 25 minutes of face to face time with the patient. Greater than 50% of time was spent in counseling and coordination of care. We discussed back to work precautions, medication compliance and BP management.   No orders of the defined types were placed in this encounter.   Meds ordered this encounter  Medications  . ranitidine (ZANTAC) 150 MG tablet    Sig: Take 150 mg by mouth as needed.   Marland Kitchen acetaminophen (TYLENOL) 500 MG tablet    Sig: Take 500 mg by mouth every 6 (six) hours as needed.    Patient Instructions  - continue eliquis and crestor for stroke prevention - resume BP medication, able to take tylenol for pain as needed - Follow up with your primary care physician for stroke risk factor modification. Recommend maintain blood pressure goal <130/80, diabetes with hemoglobin A1c goal below 7.0% and lipids with LDL cholesterol goal below 70 mg/dL.  - check BP  at home and record - follow up with cardiology as scheduled.  - able to back to work from stroke standpoint, recommend part time for 1-2 weeks before back to full schedule. - healthy diet and regular exercise.  - follow up in 4 months   Rosalin Hawking, MD PhD [facility] [address] [place], [place] [address] [phone]

## 2016-07-26 ENCOUNTER — Telehealth: Payer: Self-pay | Admitting: Neurology

## 2016-07-26 DIAGNOSIS — H8111 Benign paroxysmal vertigo, right ear: Secondary | ICD-10-CM | POA: Diagnosis not present

## 2016-07-26 DIAGNOSIS — Z6828 Body mass index (BMI) 28.0-28.9, adult: Secondary | ICD-10-CM | POA: Diagnosis not present

## 2016-07-26 NOTE — Telephone Encounter (Signed)
Pt called says she's had new vertigo starting Friday 2/23. Pt was advised to call PCP. Please call if necessary.

## 2016-07-27 ENCOUNTER — Ambulatory Visit: Payer: PPO | Admitting: Podiatry

## 2016-07-27 NOTE — Telephone Encounter (Signed)
Her condition is complicated. She has Afib on Eliquis. She has risk of ischemic stroke and hemorrhagic stroke. Her BP still not in good control and she is only on diltiazem. Please ask her to check BP at home 1-2 times a day and write it done. The dizziness could be related to HTN or stroke. If BP no problem and pt continue to have dizzy symptoms, she needs to go to ER or urgent care for further evaluation. Thanks.   Rosalin Hawking, MD PhD Stroke Neurology 07/27/2016 10:36 PM

## 2016-07-28 NOTE — Telephone Encounter (Signed)
LEft vm for patient to call back to give her Dr. Erlinda Hong recommendations concerning her blood pressure.

## 2016-07-28 NOTE — Telephone Encounter (Signed)
LEft 2nd vm for patient to call back.

## 2016-08-02 ENCOUNTER — Ambulatory Visit (INDEPENDENT_AMBULATORY_CARE_PROVIDER_SITE_OTHER): Payer: PPO | Admitting: Podiatry

## 2016-08-02 ENCOUNTER — Encounter: Payer: Self-pay | Admitting: Podiatry

## 2016-08-02 ENCOUNTER — Ambulatory Visit (INDEPENDENT_AMBULATORY_CARE_PROVIDER_SITE_OTHER): Payer: PPO

## 2016-08-02 VITALS — BP 128/75 | HR 85 | Resp 14

## 2016-08-02 DIAGNOSIS — M76821 Posterior tibial tendinitis, right leg: Secondary | ICD-10-CM

## 2016-08-02 DIAGNOSIS — M79671 Pain in right foot: Secondary | ICD-10-CM | POA: Diagnosis not present

## 2016-08-02 DIAGNOSIS — M2141 Flat foot [pes planus] (acquired), right foot: Secondary | ICD-10-CM | POA: Diagnosis not present

## 2016-08-02 DIAGNOSIS — R52 Pain, unspecified: Secondary | ICD-10-CM

## 2016-08-02 DIAGNOSIS — R600 Localized edema: Secondary | ICD-10-CM

## 2016-08-02 MED ORDER — BETAMETHASONE SOD PHOS & ACET 6 (3-3) MG/ML IJ SUSP: 3.0000 mg | Freq: Once | INTRAMUSCULAR | Status: DC

## 2016-08-02 MED ORDER — MELOXICAM 15 MG PO TABS: 15.0000 mg | ORAL_TABLET | Freq: Every day | ORAL | 1 refills | Status: DC

## 2016-08-02 NOTE — Progress Notes (Addendum)
   Subjective:  76 yr old female with a history of DM previsit presents today for evaluation of right foot and ankle pain is been going on since June 2017. Patient states that she has swelling on the inside of her ankle. Patient has constant pain when walking all day long. Patient has been soaking her foot with minimal alleviation of symptoms. Patient denies trauma.    Objective/Physical Exam General: The patient is alert and oriented x3 in no acute distress.  Dermatology: Skin is warm, dry and supple bilateral lower extremities. Negative for open lesions or macerations.  Vascular: Palpable pedal pulses bilaterally. No edema or erythema noted. Capillary refill within normal limits.  Neurological: Epicritic and protective threshold grossly intact bilaterally.   Musculoskeletal Exam: Pain on palpation noted along the posterior tibial tendon right lower extremity. When the forefoot is loaded there is a medial longitudinal arch collapse with strain of the posterior tibial tendon consistent with a posterior tibial tendon dysfunction right lower extremity. Mild edema also noted throughout the ankle joint region.  Radiographic Exam:  Joint space narrowing throughout the foot and ankle noted on radiographic exam consistent with DJD. There is some cortical irregularity on ankle Marquis's view of the medial aspect of the foot.  Assessment: #1 posterior tibial tendon dysfunction right lower extremity #2 posterior tibial tendinitis right #3 midfoot arch collapse right   Plan of Care:  #1 Patient was evaluated. #2 injection of 0.5 mL Celestone Soluspan injected in the posterior tibial tendon sheath right lower extremity #3 prescription for meloxicam 15 mg #4 recommend over-the-counter ankle brace #5 today were going to get preauthorization for a custom molded AFO.  #6 return to clinic in 4 weeks  Possible CT scan on next visit to evaluate cortical irregularity on radiographic exam taken  today.   Edrick Kins, DPM [facility]  Dr. Edrick Kins, [place]                                        [place], [place] [address]                Office [phone]  Fax [phone]

## 2016-08-04 ENCOUNTER — Ambulatory Visit: Payer: Self-pay | Admitting: Neurology

## 2016-08-10 ENCOUNTER — Other Ambulatory Visit: Payer: Self-pay

## 2016-08-10 NOTE — Patient Outreach (Signed)
First telephone outreach attempt to obtain mRS. No answer, left message for return call.   Jacqulynn Cadet  [facility] Care Management Assistant

## 2016-08-11 ENCOUNTER — Other Ambulatory Visit: Payer: Self-pay

## 2016-08-11 NOTE — Patient Outreach (Signed)
Second telephone outreach attempt to obtain mRS. No answer, left message for return call.   Diana Cervantes  [facility] Care Management Assistant

## 2016-08-12 ENCOUNTER — Other Ambulatory Visit: Payer: Self-pay

## 2016-08-12 NOTE — Patient Outreach (Signed)
Third outreach attempt to patient to obtain mRS.   3 telephone outreach attempts were completed to obtain mRS for patient. mRS could not be obtained because messages left for the patient requesting a return phone call but no return call received.   Jacqulynn Cadet  [facility] Care Management Assistant

## 2016-08-30 DIAGNOSIS — E785 Hyperlipidemia, unspecified: Secondary | ICD-10-CM | POA: Diagnosis not present

## 2016-08-30 DIAGNOSIS — I119 Hypertensive heart disease without heart failure: Secondary | ICD-10-CM | POA: Diagnosis not present

## 2016-08-30 DIAGNOSIS — I480 Paroxysmal atrial fibrillation: Secondary | ICD-10-CM | POA: Diagnosis not present

## 2016-09-01 ENCOUNTER — Ambulatory Visit (INDEPENDENT_AMBULATORY_CARE_PROVIDER_SITE_OTHER): Payer: PPO | Admitting: Podiatry

## 2016-09-01 ENCOUNTER — Encounter: Payer: Self-pay | Admitting: Podiatry

## 2016-09-01 DIAGNOSIS — M76821 Posterior tibial tendinitis, right leg: Secondary | ICD-10-CM

## 2016-09-01 DIAGNOSIS — R600 Localized edema: Secondary | ICD-10-CM

## 2016-09-01 DIAGNOSIS — M2141 Flat foot [pes planus] (acquired), right foot: Secondary | ICD-10-CM

## 2016-09-03 NOTE — Progress Notes (Signed)
   Subjective:  76 yr old female with a history of DM previsit presents today for follow up for right posterior tibial tendon dysfunction, posterior tibial tendinitis of the right foot and midfoot arch collapse of the right foot. She states the Meloxicam has improved her pain. She states the injection did not provide any relief.    Objective/Physical Exam General: The patient is alert and oriented x3 in no acute distress.  Dermatology: Skin is warm, dry and supple bilateral lower extremities. Negative for open lesions or macerations.  Vascular: Palpable pedal pulses bilaterally. No edema or erythema noted. Capillary refill within normal limits.  Neurological: Epicritic and protective threshold grossly intact bilaterally.   Musculoskeletal Exam: Pain on palpation noted along the posterior tibial tendon right lower extremity. When the forefoot is loaded there is a medial longitudinal arch collapse with strain of the posterior tibial tendon consistent with a posterior tibial tendon dysfunction right lower extremity. Mild edema also noted throughout the ankle joint region.   Assessment: #1 posterior tibial tendon dysfunction right lower extremity #2 posterior tibial tendinitis right #3 midfoot arch collapse right   Plan of Care:  #1 Patient was evaluated. #2 Casted for [employer] by Liliane Channel, Pedorthist #3 Return to clinic as needed and follow up for Richie brace    Edrick Kins, DPM [facility]  Dr. Edrick Kins, [place]                                        [place], [place] [address]                Office [phone]  Fax [phone]

## 2016-09-23 ENCOUNTER — Encounter (HOSPITAL_COMMUNITY): Payer: Self-pay | Admitting: Nurse Practitioner

## 2016-09-23 ENCOUNTER — Ambulatory Visit (HOSPITAL_COMMUNITY)
Admission: RE | Admit: 2016-09-23 | Discharge: 2016-09-23 | Disposition: A | Discharge status: Home / Self Care | Payer: PPO | Source: Ambulatory Visit | Attending: Nurse Practitioner | Admitting: Nurse Practitioner

## 2016-09-23 VITALS — BP 126/78 | HR 88 | Ht 62.0 in | Wt 160.6 lb

## 2016-09-23 DIAGNOSIS — I340 Nonrheumatic mitral (valve) insufficiency: Secondary | ICD-10-CM | POA: Diagnosis not present

## 2016-09-23 DIAGNOSIS — Z87891 Personal history of nicotine dependence: Secondary | ICD-10-CM | POA: Insufficient documentation

## 2016-09-23 DIAGNOSIS — Z8673 Personal history of transient ischemic attack (TIA), and cerebral infarction without residual deficits: Secondary | ICD-10-CM | POA: Insufficient documentation

## 2016-09-23 DIAGNOSIS — I481 Persistent atrial fibrillation: Secondary | ICD-10-CM

## 2016-09-23 DIAGNOSIS — I10 Essential (primary) hypertension: Secondary | ICD-10-CM | POA: Insufficient documentation

## 2016-09-23 DIAGNOSIS — Z7901 Long term (current) use of anticoagulants: Secondary | ICD-10-CM | POA: Insufficient documentation

## 2016-09-23 DIAGNOSIS — I4891 Unspecified atrial fibrillation: Secondary | ICD-10-CM | POA: Diagnosis not present

## 2016-09-23 DIAGNOSIS — E119 Type 2 diabetes mellitus without complications: Secondary | ICD-10-CM | POA: Insufficient documentation

## 2016-09-23 DIAGNOSIS — I4819 Other persistent atrial fibrillation: Secondary | ICD-10-CM

## 2016-09-23 NOTE — Progress Notes (Signed)
Primary Care Physician: Velna Hatchet, MD Referring Physician: [facility] f/u   Diana Cervantes is a 76 y.o. female that in the afib clinic for f/u of recent CVA and persistent atrial fibrillation. Pt was first diagnosed in May of this year after presenting with an URI. She was given RX for  xarelto and cardizem and asked to f/u in the afib clinic. Several attempts were made to contact pt as well as messages left on machine and letters were sent and she never followed up. She also did not start her meds.  She then presented to [facility] 12/5 after she fell in the bathroom and her son found her with severe right hemiparesis and aphagia.She arrived via EMS as a code stroke. CT angiogram demonstrated a left MCA occlusion. SHe was not a TPA candidate due to anticoagulation which was listed on the chart but pt had never started. She went to IR and received TICI revascularization with [place] and Integrelin.  In the clinic, pt does not appear to have any neuro deficients.She however was not started on DOAC due to small amount of hemorrhage associated with infarct. She was discharged on 325 mg of ASA which pt decreased on her onw to 81 mg of asa a day due to thinking it made her BP higher and was scheduled for a repeat CT of the brain to make sure bleeding was resolved. However, becAUSE of insurance issues the CT of the head  was delayed and is not scheduled until Friday  After review, per pt and son, they will be notified based on results to start [place]. She continues to be in asymptomatic afib.   F/u 1/2. She had CT on Friday but not not been called results but shows the hemorrhage to be resolved so pt can start Eliquis and stop ASA. She still states that afib is not bothering her.  V rates are controlled today by EKG and her V/S  recorded at home show v rates in the 80's.  Returns to afib clinic 1/25 and has been on DOAC x 3 weeks and is tolerating well. No neuro symptoms. Minimally aware of afib. Can do her  daily activities without undue fatigue or shortness of breath. Well rate controlled.  F/u afib clinic 4/26. She reports that she is doing well with no symptoms/awareness of atrial flutter as by EKG today. She is doing all of her ADL without any issues. No further stroke symptoms. No bleeding issues.   Today, she denies symptoms of palpitations, chest pain, shortness of breath, orthopnea, PND, lower extremity edema, dizziness, presyncope, syncope, or neurologic sequela. The patient is tolerating medications without difficulties and is otherwise without complaint today.   Past Medical History:  Diagnosis Date  . Cataract   . Diabetes mellitus without complication ([place])   . Hypertension    Past Surgical History:  Procedure Laterality Date  . ABDOMINAL HYSTERECTOMY    . CHOLECYSTECTOMY    . IR GENERIC HISTORICAL  05/04/2016   IR ANGIO INTRA EXTRACRAN SEL COM CAROTID INNOMINATE UNI R MOD SED 05/04/2016 Luanne Bras, MD [facility]  . IR GENERIC HISTORICAL  05/04/2016   IR ANGIO VERTEBRAL SEL VERTEBRAL UNI L MOD SED 05/04/2016 Luanne Bras, MD [facility]  . IR GENERIC HISTORICAL  05/04/2016   IR PERCUTANEOUS ART THROMBECTOMY/INFUSION INTRACRANIAL INC DIAG ANGIO 05/04/2016 Luanne Bras, MD [facility]  . IR GENERIC HISTORICAL  06/22/2016   IR RADIOLOGIST EVAL & MGMT 06/22/2016 [facility]  . RADIOLOGY WITH ANESTHESIA N/A  05/04/2016   Procedure: RADIOLOGY WITH ANESTHESIA;  Surgeon: Luanne Bras, MD;  Location: [place];  Service: Radiology;  Laterality: N/A;    Current Outpatient Prescriptions  Medication Sig Dispense Refill  . acetaminophen (TYLENOL) 500 MG tablet Take 500 mg by mouth every 6 (six) hours as needed.    Marland Kitchen apixaban (ELIQUIS) 5 MG TABS tablet Take 1 tablet (5 mg total) by mouth 2 (two) times daily. 60 tablet 6  . B Complex-C (SUPER B COMPLEX PO) Take 1 tablet by mouth daily.     Marland Kitchen CALCIUM PO Take 1 tablet by mouth daily.     . Cholecalciferol (VITAMIN D-3)  1000 units CAPS Take 1,000 Units by mouth daily.     Marland Kitchen diltiazem (CARDIZEM CD) 120 MG 24 hr capsule Take 120 mg by mouth daily.    Marland Kitchen linagliptin (TRADJENTA) 5 MG TABS tablet Take 5 mg by mouth daily.    . meclizine (ANTIVERT) 12.5 MG tablet     . Probiotic Product (PROBIOTIC DAILY PO) Take 1 tablet by mouth daily.     . ranitidine (ZANTAC) 150 MG tablet Take 150 mg by mouth as needed.     . rosuvastatin (CRESTOR) 10 MG tablet Take 10 mg by mouth daily.     Current Facility-Administered Medications  Medication Dose Route Frequency Provider Last Rate Last Dose  . betamethasone acetate-betamethasone sodium phosphate (CELESTONE) injection 3 mg  3 mg Intramuscular Once Edrick Kins, DPM        Allergies  Allergen Reactions  . Latex Other (See Comments)    Reaction??  . Xarelto [Rivaroxaban] Other (See Comments)    Made patient "not feel like herself"    Social History   Social History  . Marital status: Divorced    Spouse name: N/A  . Number of children: N/A  . Years of education: N/A   Occupational History  . Not on file.   Social History Main Topics  . Smoking status: Former Smoker    Quit date: 10/21/2013  . 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 Alcohol use No  . Drug use: No  . Sexual activity: Not on file   Other Topics Concern  . Not on file   Social History Narrative  . No narrative on file    Family History  Problem Relation Age of Onset  . Stroke Mother   . Stroke Father     ROS- All systems are reviewed and negative except as per the HPI above  Physical Exam: Vitals:   09/23/16 1027  BP: 126/78  Pulse: 88  Weight: 160 lb 9.6 oz (72.8 kg)  Height: 5\' 2"  (1.575 m)   Wt Readings from Last 3 Encounters:  09/23/16 160 lb 9.6 oz (72.8 kg)  07/21/16 156 lb 9.6 oz (71 kg)  06/24/16 158 lb 6.4 oz (71.8 kg)    Labs: Lab Results  Component Value Date   NA 144 05/06/2016   K 3.6 05/06/2016   CL 111 05/06/2016     CO2 23 05/06/2016   GLUCOSE 124 (H) 05/06/2016   BUN 8 05/06/2016   CREATININE 0.64 05/06/2016   CALCIUM 6.7 (L) 05/06/2016   PHOS 4.3 05/06/2016   MG 1.8 05/06/2016   Lab Results  Component Value Date   INR 1.05 05/04/2016   Lab Results  Component Value Date   CHOL 77 05/05/2016   HDL 30 (L) 05/05/2016   LDLCALC 32 05/05/2016  TRIG 77 05/05/2016     GEN- The patient is well appearing, alert and oriented x 3 today.   Head- normocephalic, atraumatic Eyes-  Sclera clear, conjunctiva pink Ears- hearing intact Oropharynx- clear Neck- supple, no JVP Lymph- no cervical lymphadenopathy Lungs- Clear to ausculation bilaterally, normal work of breathing Heart- irregular rate and rhythm, no murmurs, rubs or gallops, PMI not laterally displaced GI- soft, NT, ND, + BS Extremities- no clubbing, cyanosis, or edema MS- no significant deformity or atrophy Skin- no rash or lesion Psych- euthymic mood, full affect Neuro- strength and sensation are intact  EKG- aflutter at 88 bpm, qrs int 82 ms, qtc 411 ms  Epic records reviewed PCP notes from 12/22 reviewed CT of head 12/29-IMPRESSION: 1. Expected evolution of subacute left MCA distribution infarcts. Mass effect and petechial hemorrhagic density have resolved. 2. Remote bilateral cerebral infarcts. No acute or interval infarct. Echo-Study Conclusions  - Left ventricle: The cavity size was normal. Wall thickness was   increased in a pattern of mild LVH. Systolic function was normal.   The estimated ejection fraction was in the range of 55% to 60%.   Wall motion was normal; there were no regional wall motion   abnormalities. - Aortic valve: Mildly calcified annulus. - Mitral valve: There was mild regurgitation. - Left atrium: The atrium was mildly dilated.    Assessment and Plan:  1. Persistent aymptomatic afib with recent CVA  CT of the head 12/29 showed hemorrhage to be resolved. DOAC started and continues on eliquis 5  mg bid She is rate controlled in the office today with V rates in the 80's Continue cardizem 120 mg qd  Since pt is doing well/asymptomatic and normal EF,  anticipate long term rate control, I will turn over to PCP, neurology, but can see back if any issues with arrhythmia in the future.   Geroge Baseman Zillah Alexie, [facility] [address] [place], [place] [address] [phone]

## 2016-11-03 ENCOUNTER — Encounter: Payer: PPO | Admitting: Podiatry

## 2016-11-03 DIAGNOSIS — I10 Essential (primary) hypertension: Secondary | ICD-10-CM | POA: Diagnosis not present

## 2016-11-03 DIAGNOSIS — M76821 Posterior tibial tendinitis, right leg: Secondary | ICD-10-CM

## 2016-11-03 DIAGNOSIS — E785 Hyperlipidemia, unspecified: Secondary | ICD-10-CM | POA: Diagnosis not present

## 2016-11-03 DIAGNOSIS — E119 Type 2 diabetes mellitus without complications: Secondary | ICD-10-CM | POA: Diagnosis not present

## 2016-11-03 DIAGNOSIS — I480 Paroxysmal atrial fibrillation: Secondary | ICD-10-CM | POA: Diagnosis not present

## 2016-11-03 DIAGNOSIS — L659 Nonscarring hair loss, unspecified: Secondary | ICD-10-CM | POA: Diagnosis not present

## 2016-11-03 DIAGNOSIS — Z6825 Body mass index (BMI) 25.0-25.9, adult: Secondary | ICD-10-CM | POA: Diagnosis not present

## 2016-11-18 ENCOUNTER — Ambulatory Visit (INDEPENDENT_AMBULATORY_CARE_PROVIDER_SITE_OTHER): Payer: PPO | Admitting: Nurse Practitioner

## 2016-11-18 ENCOUNTER — Encounter: Payer: Self-pay | Admitting: Nurse Practitioner

## 2016-11-18 VITALS — BP 143/75 | HR 64 | Ht 62.0 in | Wt 159.6 lb

## 2016-11-18 DIAGNOSIS — I482 Chronic atrial fibrillation, unspecified: Secondary | ICD-10-CM

## 2016-11-18 DIAGNOSIS — I63312 Cerebral infarction due to thrombosis of left middle cerebral artery: Secondary | ICD-10-CM

## 2016-11-18 DIAGNOSIS — E785 Hyperlipidemia, unspecified: Secondary | ICD-10-CM

## 2016-11-18 DIAGNOSIS — I10 Essential (primary) hypertension: Secondary | ICD-10-CM | POA: Diagnosis not present

## 2016-11-18 NOTE — Patient Instructions (Addendum)
Stressed the importance of management of risk factors to prevent further stroke Continue Eliquis for secondary stroke prevention  Maintain strict control of hypertension with blood pressure goal below 130/90, today's reading 143/75 continue antihypertensive medications Control of diabetes with hemoglobin A1c below 6.5 followed by primary care most recent hemoglobin A1c 6.5 continue diabetic medications Cholesterol with LDL cholesterol less than 70, followed by primary care,  Continue Crestor Exercise by walking, , eat healthy diet with whole grains,  fresh fruits and vegetables Discharge from stroke clinic Stroke Prevention Some medical conditions and behaviors are associated with an increased chance of having a stroke. You may prevent a stroke by making healthy choices and managing medical conditions. How can I reduce my risk of having a stroke?  Stay physically active. Get at least 30 minutes of activity on most or all days.  Do not smoke. It may also be helpful to avoid exposure to secondhand smoke.  Limit alcohol use. Moderate alcohol use is considered to be: ? No more than 2 drinks per day for men. ? No more than 1 drink per day for nonpregnant women.  Eat healthy foods. This involves: ? Eating 5 or more servings of fruits and vegetables a day. ? Making dietary changes that address high blood pressure (hypertension), high cholesterol, diabetes, or obesity.  Manage your cholesterol levels. ? Making food choices that are high in fiber and low in saturated fat, trans fat, and cholesterol may control cholesterol levels. ? Take any prescribed medicines to control cholesterol as directed by your health care provider.  Manage your diabetes. ? Controlling your carbohydrate and sugar intake is recommended to manage diabetes. ? Take any prescribed medicines to control diabetes as directed by your health care provider.  Control your hypertension. ? Making food choices that are low in salt  (sodium), saturated fat, trans fat, and cholesterol is recommended to manage hypertension. ? Ask your health care provider if you need treatment to lower your blood pressure. Take any prescribed medicines to control hypertension as directed by your health care provider. ? If you are 34-65 years of age, have your blood pressure checked every 3-5 years. If you are 33 years of age or older, have your blood pressure checked every year.  Maintain a healthy weight. ? Reducing calorie intake and making food choices that are low in sodium, saturated fat, trans fat, and cholesterol are recommended to manage weight.  Stop drug abuse.  Avoid taking birth control pills. ? Talk to your health care provider about the risks of taking birth control pills if you are over 32 years old, smoke, get migraines, or have ever had a blood clot.  Get evaluated for sleep disorders (sleep apnea). ? Talk to your health care provider about getting a sleep evaluation if you snore a lot or have excessive sleepiness.  Take medicines only as directed by your health care provider. ? For some people, aspirin or blood thinners (anticoagulants) are helpful in reducing the risk of forming abnormal blood clots that can lead to stroke. If you have the irregular heart rhythm of atrial fibrillation, you should be on a blood thinner unless there is a good reason you cannot take them. ? Understand all your medicine instructions.  Make sure that other conditions (such as anemia or atherosclerosis) are addressed. Get help right away if:  You have sudden weakness or numbness of the face, arm, or leg, especially on one side of the body.  Your face or eyelid droops to one  side.  You have sudden confusion.  You have trouble speaking (aphasia) or understanding.  You have sudden trouble seeing in one or both eyes.  You have sudden trouble walking.  You have dizziness.  You have a loss of balance or coordination.  You have a  sudden, severe headache with no known cause.  You have new chest pain or an irregular heartbeat. Any of these symptoms may represent a serious problem that is an emergency. Do not wait to see if the symptoms will go away. Get medical help at once. Call your local emergency services (911 in [place] Do not drive yourself to the hospital. This information is not intended to replace advice given to you by your health care provider. Make sure you discuss any questions you have with your health care provider. Document Released: 06/24/2004 Document Revised: 10/23/2015 Document Reviewed: 11/17/2012 Elsevier Interactive Patient Education  2017 [employer].

## 2016-11-18 NOTE — Progress Notes (Signed)
[facility]  PATIENT: Diana Cervantes DOB: 1941/05/31   REASON FOR VISIT: Follow-up for stroke history of atrial fibrillation HISTORY FROM: Patient     HISTORY OF PRESENT ILLNESS:Diana.Diana Cervantes a 76 y.o.femalewith history of atrial fibrillation admitted on 05/04/16 found down with R hemiparesis and aphasia.Shedidnot receive IV t-PA due to being on Xarelto.CT hyperdense L MCA, old right parietal and left CR infarct. CTA head and neck showed L MCA occlusion. Taken to IR where she received TICI 3 revascularization.MRI showed left caudate and putamen hemorrhagic infarcts, and left frontal and insular small infarcts. MRA unremarkable after IR. EF 55-60%. LDL 32 and A1C 6.5. Put on ASA. Crestor resumed. Anticoagulation was on hold on discharge.   Interval HistoryDR. XU2/21/18 During the interval time, the patient has been doing well. Repeat CT head on 05/28/16 showed resolution of hemorrhage and her ASA changed to eliquis 5mg  bid. No side effects so far. However, her BP still not in good control and her BP meds have not been restarted yet. Today BP 150/75.  Physical  Diana Cervantes, 76 year old female returns for follow-up for stroke event,left caudate and putamen hemorrhagic infarcts, and left frontal and insular small infarcts in December 2017. She remains on eliquis for secondary stroke prevention and atrial fibrillation. She is on Crestor for hyperlipidemia without myalgias. She continues to work. Blood pressure in the office 143/75 . She remains on Januvia. She does not check blood sugars. She has not had further stroke or TIA symptoms. She returns for reevaluation  REVIEW OF SYSTEMS: Full 14 system review of systems performed and notable only for those listed, all others are neg:  Constitutional: neg  Cardiovascular: neg Ear/Nose/Throat: neg  Skin: neg Eyes: neg Respiratory: neg Gastroitestinal: neg  Hematology/Lymphatic: neg  Endocrine:  neg Musculoskeletal:neg Allergy/Immunology: neg Neurological: neg Psychiatric: neg Sleep : neg   ALLERGIES: Allergies  Allergen Reactions  . Latex Other (See Comments)    Reaction??  . Xarelto [Rivaroxaban] Other (See Comments)    Made patient "not feel like herself"    HOME MEDICATIONS: Outpatient Medications Prior to Visit  Medication Sig Dispense Refill  . acetaminophen (TYLENOL) 500 MG tablet Take 500 mg by mouth every 6 (six) hours as needed.    Marland Kitchen apixaban (ELIQUIS) 5 MG TABS tablet Take 1 tablet (5 mg total) by mouth 2 (two) times daily. 60 tablet 6  . B Complex-C (SUPER B COMPLEX PO) Take 1 tablet by mouth daily.     Marland Kitchen CALCIUM PO Take 1 tablet by mouth daily.     . Cholecalciferol (VITAMIN D-3) 1000 units CAPS Take 1,000 Units by mouth daily.     Marland Kitchen diltiazem (CARDIZEM CD) 120 MG 24 hr capsule Take 120 mg by mouth daily.    . Probiotic Product (PROBIOTIC DAILY PO) Take 1 tablet by mouth daily.     . ranitidine (ZANTAC) 150 MG tablet Take 150 mg by mouth as needed.     . rosuvastatin (CRESTOR) 10 MG tablet Take 10 mg by mouth daily.    Marland Kitchen linagliptin (TRADJENTA) 5 MG TABS tablet Take 5 mg by mouth daily.    . meclizine (ANTIVERT) 12.5 MG tablet      Facility-Administered Medications Prior to Visit  Medication Dose Route Frequency Provider Last Rate Last Dose  . betamethasone acetate-betamethasone sodium phosphate (CELESTONE) injection 3 mg  3 mg Intramuscular Once Edrick Kins, DPM        PAST MEDICAL HISTORY: Past Medical History:  Diagnosis Date  .  Cataract   . Diabetes mellitus without complication ([place])   . Hypertension     PAST SURGICAL HISTORY: Past Surgical History:  Procedure Laterality Date  . ABDOMINAL HYSTERECTOMY    . CHOLECYSTECTOMY    . IR GENERIC HISTORICAL  05/04/2016   IR ANGIO INTRA EXTRACRAN SEL COM CAROTID INNOMINATE UNI R MOD SED 05/04/2016 Luanne Bras, MD [facility]  . IR GENERIC HISTORICAL  05/04/2016   IR ANGIO VERTEBRAL SEL  VERTEBRAL UNI L MOD SED 05/04/2016 Luanne Bras, MD [facility]  . IR GENERIC HISTORICAL  05/04/2016   IR PERCUTANEOUS ART THROMBECTOMY/INFUSION INTRACRANIAL INC DIAG ANGIO 05/04/2016 Luanne Bras, MD [facility]  . IR GENERIC HISTORICAL  06/22/2016   IR RADIOLOGIST EVAL & MGMT 06/22/2016 [facility]  . RADIOLOGY WITH ANESTHESIA N/A 05/04/2016   Procedure: RADIOLOGY WITH ANESTHESIA;  Surgeon: Luanne Bras, MD;  Location: [place];  Service: Radiology;  Laterality: N/A;    FAMILY HISTORY: Family History  Problem Relation Age of Onset  . Stroke Mother   . Stroke Father     SOCIAL HISTORY: Social History   Social History  . Marital status: Divorced    Spouse name: N/A  . Number of children: N/A  . Years of education: N/A   Occupational History  . Not on file.   Social History Main Topics  . Smoking status: Former Smoker    Quit date: 10/21/2013  . 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 Alcohol use No  . Drug use: No  . Sexual activity: Not on file   Other Topics Concern  . Not on file   Social History Narrative  . No narrative on file     PHYSICAL EXAM  Vitals:   11/18/16 1256  BP: (!) 143/75  Pulse: 64  Weight: 159 lb 9.6 oz (72.4 kg)  Height: 5\' 2"  (1.575 m)   Body mass index is 29.19 kg/m.  Generalized: Well developed, in no acute distress  Head: normocephalic and atraumatic,. Oropharynx benign  Neck: Supple, no carotid bruits  Cardiac: Irregular irregular rate rhythm, no murmur  Musculoskeletal: No deformity   Neurological examination   Mentation: Alert oriented to time, place, history taking. Attention span and concentration appropriate. Recent and remote memory intact.  Follows all commands speech and language fluent.   Cranial nerve II-XII: Fundoscopic exam reveals sharp disc margins.Pupils were equal round reactive to light extraocular movements were full, visual field were full on  confrontational test. Facial sensation and strength were normal. hearing was intact to finger rubbing bilaterally. Uvula tongue midline. head turning and shoulder shrug were normal and symmetric.Tongue protrusion into cheek strength was normal. Motor: normal bulk and tone, full strength in the BUE, BLE, fine finger movements normal, no pronator drift. No focal weakness Sensory: normal and symmetric to light touch, pinprick, and  Vibration, in the upper and lower extremities Coordination: finger-nose-finger, heel-to-shin bilaterally, no dysmetria, no tremor Reflexes: 1+ upper lower and symmetric plantar responses were flexor bilaterally. Gait and Station: Rising up from seated position without assistance, normal stance,  moderate stride, good arm swing, smooth turning, able to perform tiptoe, and heel walking without difficulty. Tandem gait is steady. No assistive device  DIAGNOSTIC DATA (LABS, IMAGING, TESTING) - I reviewed patient records, labs, notes, testing and imaging myself where available.  Lab Results  Component Value Date   WBC 8.5 06/24/2016   HGB 11.6 (L) 06/24/2016   HCT 37.0 06/24/2016  MCV 77.7 (L) 06/24/2016   PLT 223 06/24/2016      Component Value Date/Time   NA 144 05/06/2016 0332   K 3.6 05/06/2016 0332   CL 111 05/06/2016 0332   CO2 23 05/06/2016 0332   GLUCOSE 124 (H) 05/06/2016 0332   BUN 8 05/06/2016 0332   CREATININE 0.64 05/06/2016 0332   CALCIUM 6.7 (L) 05/06/2016 0332   PROT 5.6 (L) 05/05/2016 0545   ALBUMIN 2.9 (L) 05/05/2016 0545   AST 20 05/05/2016 0545   ALT 15 05/05/2016 0545   ALKPHOS 61 05/05/2016 0545   BILITOT 0.5 05/05/2016 0545   GFRNONAA >60 05/06/2016 0332   GFRAA >60 05/06/2016 0332   Lab Results  Component Value Date   CHOL 77 05/05/2016   HDL 30 (L) 05/05/2016   LDLCALC 32 05/05/2016   TRIG 77 05/05/2016   CHOLHDL 2.6 05/05/2016   Lab Results  Component Value Date   HGBA1C 6.5 (H) 05/05/2016       ASSESSMENT AND PLAN 76  y.o. Caucasian female with PMH of afib on Xarelto admitted on 05/04/16 found down with R hemiparesis and aphasia.Shedidnot receive IV t-PA due to being on Xarelto.CT hyperdense L MCA, old right parietal and left CR infarct. CTA head and neck showed L MCA occlusion. Taken to IR where she received TICI 3 revascularization.MRI showed left caudate and putamen hemorrhagic infarcts, and left frontal and insular small infarcts. MRA unremarkable after IR. EF 55-60%. LDL 32 and A1C 6.5. Put on ASA. Crestor resumed. Eliquis started after repeat CT head on 05/28/16 showed resolution of hemorrhage. Neuro deficit resolved.    PLAN: Stressed the importance of management of risk factors to prevent further stroke Continue Eliquis for secondary stroke prevention  Maintain strict control of hypertension with blood pressure goal below 130/90, today's reading 143/75 continue antihypertensive medications Control of diabetes with hemoglobin A1c below 6.5 followed by primary care most recent hemoglobin A1c 6.5 continue diabetic medications Cholesterol with LDL cholesterol less than 70, followed by primary care,  Continue Crestor Exercise by walking, , eat healthy diet with whole grains,  fresh fruits and vegetables Continue follow-up with primary care for labs and  management of risk factors Discharge from stroke clinic This was a  visit requiring 25 minutes and medical decision making of high complexity with extensive review of history, hospital chart, counseling and answering questions Dennie Bible, [facility], [facility], APRN  [facility] [address], [place], [place] [address] [phone]

## 2016-11-20 NOTE — Progress Notes (Signed)
This encounter was created in error - please disregard.

## 2016-11-24 DIAGNOSIS — H40033 Anatomical narrow angle, bilateral: Secondary | ICD-10-CM | POA: Diagnosis not present

## 2016-11-24 DIAGNOSIS — H04123 Dry eye syndrome of bilateral lacrimal glands: Secondary | ICD-10-CM | POA: Diagnosis not present

## 2016-12-03 ENCOUNTER — Telehealth: Payer: Self-pay | Admitting: Podiatry

## 2016-12-03 NOTE — Telephone Encounter (Signed)
lvm for pt to call to schedule appt to pick up brace.(ok to schedule with Benjie Karvonen per Liliane Channel)

## 2016-12-11 NOTE — Progress Notes (Signed)
Personally  participated in, made any corrections needed, and agree with history, physical, neuro exam,assessment and plan as stated.     Leisha Trinkle, MD [facility]     

## 2016-12-20 ENCOUNTER — Ambulatory Visit (INDEPENDENT_AMBULATORY_CARE_PROVIDER_SITE_OTHER): Payer: PPO | Admitting: Orthotics

## 2016-12-20 DIAGNOSIS — M76821 Posterior tibial tendinitis, right leg: Secondary | ICD-10-CM | POA: Diagnosis not present

## 2016-12-20 DIAGNOSIS — R600 Localized edema: Secondary | ICD-10-CM

## 2016-12-20 NOTE — Progress Notes (Signed)
Patient  came in to pick up Diana Cervantes brace   The brace fit well and immediately patient's  gait approved.  The brace provided desired m-l stability and allowed free ROM in  frontal/transverse planes . Patient was able to don and doff brace with minimal difficulty.  Overall patient pleased with fit and functionality of brace. Brace went easily into current shoe.Marland KitchenMarland Kitchen

## 2017-01-11 ENCOUNTER — Other Ambulatory Visit (HOSPITAL_COMMUNITY): Payer: Self-pay | Admitting: Nurse Practitioner

## 2017-01-24 ENCOUNTER — Other Ambulatory Visit: Payer: Self-pay | Admitting: Podiatry

## 2017-01-25 NOTE — Telephone Encounter (Signed)
Pt needs an appt prior to future refills. 

## 2017-02-02 DIAGNOSIS — E119 Type 2 diabetes mellitus without complications: Secondary | ICD-10-CM | POA: Diagnosis not present

## 2017-02-02 DIAGNOSIS — Z0000 Encounter for general adult medical examination without abnormal findings: Secondary | ICD-10-CM | POA: Diagnosis not present

## 2017-02-02 DIAGNOSIS — E784 Other hyperlipidemia: Secondary | ICD-10-CM | POA: Diagnosis not present

## 2017-02-02 DIAGNOSIS — I10 Essential (primary) hypertension: Secondary | ICD-10-CM | POA: Diagnosis not present

## 2017-02-10 DIAGNOSIS — Z1212 Encounter for screening for malignant neoplasm of rectum: Secondary | ICD-10-CM | POA: Diagnosis not present

## 2017-02-24 DIAGNOSIS — I10 Essential (primary) hypertension: Secondary | ICD-10-CM | POA: Diagnosis not present

## 2017-02-24 DIAGNOSIS — I480 Paroxysmal atrial fibrillation: Secondary | ICD-10-CM | POA: Diagnosis not present

## 2017-02-24 DIAGNOSIS — Z23 Encounter for immunization: Secondary | ICD-10-CM | POA: Diagnosis not present

## 2017-02-24 DIAGNOSIS — I629 Nontraumatic intracranial hemorrhage, unspecified: Secondary | ICD-10-CM | POA: Diagnosis not present

## 2017-02-24 DIAGNOSIS — E784 Other hyperlipidemia: Secondary | ICD-10-CM | POA: Diagnosis not present

## 2017-02-24 DIAGNOSIS — Z6828 Body mass index (BMI) 28.0-28.9, adult: Secondary | ICD-10-CM | POA: Diagnosis not present

## 2017-02-24 DIAGNOSIS — M1990 Unspecified osteoarthritis, unspecified site: Secondary | ICD-10-CM | POA: Diagnosis not present

## 2017-02-24 DIAGNOSIS — Z0000 Encounter for general adult medical examination without abnormal findings: Secondary | ICD-10-CM | POA: Diagnosis not present

## 2017-02-24 DIAGNOSIS — Z1389 Encounter for screening for other disorder: Secondary | ICD-10-CM | POA: Diagnosis not present

## 2017-02-24 DIAGNOSIS — E119 Type 2 diabetes mellitus without complications: Secondary | ICD-10-CM | POA: Diagnosis not present

## 2017-04-04 ENCOUNTER — Other Ambulatory Visit: Payer: Self-pay | Admitting: Internal Medicine

## 2017-04-04 DIAGNOSIS — Z1231 Encounter for screening mammogram for malignant neoplasm of breast: Secondary | ICD-10-CM

## 2017-04-06 ENCOUNTER — Ambulatory Visit
Admission: RE | Admit: 2017-04-06 | Discharge: 2017-04-06 | Disposition: A | Discharge status: Home / Self Care | Payer: PPO | Source: Ambulatory Visit | Attending: Internal Medicine | Admitting: Internal Medicine

## 2017-04-06 DIAGNOSIS — Z1231 Encounter for screening mammogram for malignant neoplasm of breast: Secondary | ICD-10-CM | POA: Diagnosis not present

## 2017-04-07 ENCOUNTER — Other Ambulatory Visit: Payer: Self-pay | Admitting: Internal Medicine

## 2017-04-07 DIAGNOSIS — R928 Other abnormal and inconclusive findings on diagnostic imaging of breast: Secondary | ICD-10-CM

## 2017-04-27 ENCOUNTER — Other Ambulatory Visit: Payer: Self-pay | Admitting: Internal Medicine

## 2017-04-27 ENCOUNTER — Ambulatory Visit
Admission: RE | Admit: 2017-04-27 | Discharge: 2017-04-27 | Disposition: A | Discharge status: Home / Self Care | Payer: PPO | Source: Ambulatory Visit | Attending: Internal Medicine | Admitting: Internal Medicine

## 2017-04-27 DIAGNOSIS — R928 Other abnormal and inconclusive findings on diagnostic imaging of breast: Secondary | ICD-10-CM

## 2017-04-27 DIAGNOSIS — N6489 Other specified disorders of breast: Secondary | ICD-10-CM | POA: Diagnosis not present

## 2017-05-18 ENCOUNTER — Emergency Department (HOSPITAL_COMMUNITY)
Admission: EM | Admit: 2017-05-18 | Discharge: 2017-05-18 | Disposition: A | Discharge status: Home / Self Care | Payer: PPO | Attending: Emergency Medicine | Admitting: Emergency Medicine

## 2017-05-18 ENCOUNTER — Encounter (HOSPITAL_COMMUNITY): Payer: Self-pay | Admitting: Emergency Medicine

## 2017-05-18 ENCOUNTER — Emergency Department (HOSPITAL_COMMUNITY): Payer: PPO

## 2017-05-18 ENCOUNTER — Other Ambulatory Visit: Payer: Self-pay

## 2017-05-18 DIAGNOSIS — Z8679 Personal history of other diseases of the circulatory system: Secondary | ICD-10-CM | POA: Diagnosis not present

## 2017-05-18 DIAGNOSIS — Z79899 Other long term (current) drug therapy: Secondary | ICD-10-CM | POA: Insufficient documentation

## 2017-05-18 DIAGNOSIS — Z87891 Personal history of nicotine dependence: Secondary | ICD-10-CM | POA: Diagnosis not present

## 2017-05-18 DIAGNOSIS — Z7984 Long term (current) use of oral hypoglycemic drugs: Secondary | ICD-10-CM | POA: Insufficient documentation

## 2017-05-18 DIAGNOSIS — E119 Type 2 diabetes mellitus without complications: Secondary | ICD-10-CM | POA: Insufficient documentation

## 2017-05-18 DIAGNOSIS — Z91040 Latex allergy status: Secondary | ICD-10-CM | POA: Insufficient documentation

## 2017-05-18 DIAGNOSIS — R0602 Shortness of breath: Secondary | ICD-10-CM | POA: Diagnosis not present

## 2017-05-18 DIAGNOSIS — I10 Essential (primary) hypertension: Secondary | ICD-10-CM | POA: Diagnosis not present

## 2017-05-18 DIAGNOSIS — E876 Hypokalemia: Secondary | ICD-10-CM | POA: Insufficient documentation

## 2017-05-18 LAB — I-STAT TROPONIN, ED
Troponin i, poc: 0.02 ng/mL (ref 0.00–0.08)
Troponin i, poc: 0 ng/mL (ref 0.00–0.08)

## 2017-05-18 LAB — CBC
HCT: 34.5 % — ABNORMAL LOW (ref 36.0–46.0)
Hemoglobin: 11 g/dL — ABNORMAL LOW (ref 12.0–15.0)
MCH: 25.9 pg — AB (ref 26.0–34.0)
MCHC: 31.9 g/dL (ref 30.0–36.0)
MCV: 81.2 fL (ref 78.0–100.0)
PLATELETS: 202 10*3/uL (ref 150–400)
RBC: 4.25 MIL/uL (ref 3.87–5.11)
RDW: 14.8 % (ref 11.5–15.5)
WBC: 7.4 10*3/uL (ref 4.0–10.5)

## 2017-05-18 LAB — BASIC METABOLIC PANEL
Anion gap: 13 (ref 5–15)
BUN: 14 mg/dL (ref 6–20)
CHLORIDE: 100 mmol/L — AB (ref 101–111)
CO2: 27 mmol/L (ref 22–32)
CREATININE: 0.78 mg/dL (ref 0.44–1.00)
Calcium: 7.2 mg/dL — ABNORMAL LOW (ref 8.9–10.3)
GFR calc non Af Amer: >60 mL/min (ref >60)
Glucose, Bld: 136 mg/dL — ABNORMAL HIGH (ref 65–99)
Potassium: 3.1 mmol/L — ABNORMAL LOW (ref 3.5–5.1)
Sodium: 140 mmol/L (ref 135–145)

## 2017-05-18 MED ORDER — POTASSIUM CHLORIDE ER 10 MEQ PO TBCR: 20.0000 meq | EXTENDED_RELEASE_TABLET | Freq: Every day | ORAL | 0 refills | Status: DC

## 2017-05-18 MED ORDER — DILTIAZEM HCL ER COATED BEADS 120 MG PO CP24
120.0000 mg | ORAL_CAPSULE | Freq: Once | ORAL | Status: AC
  Administered 2017-05-18: 120 mg via ORAL
  Filled 2017-05-18: qty 1

## 2017-05-18 MED ORDER — POTASSIUM CHLORIDE CRYS ER 20 MEQ PO TBCR
40.0000 meq | EXTENDED_RELEASE_TABLET | Freq: Once | ORAL | Status: AC
  Administered 2017-05-18: 40 meq via ORAL
  Filled 2017-05-18: qty 2

## 2017-05-18 MED ORDER — NITROGLYCERIN 0.4 MG SL SUBL: 0.4000 mg | SUBLINGUAL_TABLET | SUBLINGUAL | Status: DC | PRN

## 2017-05-18 NOTE — ED Notes (Signed)
Pt continues to deny SOB

## 2017-05-18 NOTE — ED Provider Notes (Signed)
[facility] EMERGENCY DEPARTMENT Provider Note   CSN: [id number] Arrival date & time: 05/18/17  0540     History   Chief Complaint Chief Complaint  Patient presents with  . Shortness of Breath    HPI Diana Cervantes is a 76 y.o. female.  HPI Awakened this morning.  She noted after getting up and starting her usual routine, that she felt short of breath.  She reports she had a tight feeling in her chest when she was breathing.  No specific chest pain.  No nausea no vomiting no syncope or near syncope.  Patient denies that she was awakened from sleep due to being short of breath.  No lower extremity swelling or calf pain.  Patient reports she felt more short of breath with exertion. Past Medical History:  Diagnosis Date  . Cataract   . Diabetes mellitus without complication ([place])   . Hypertension     Patient Active Problem List   Diagnosis Date Noted  . Hyperlipemia 11/18/2016  . Chronic atrial fibrillation ([place]) 07/22/2016  . Chronic anticoagulation 07/22/2016  . Essential hypertension 07/22/2016  . Cerebrovascular accident (CVA) due to thrombosis of left middle cerebral artery ([place]) 05/04/2016  . Right sided weakness     Past Surgical History:  Procedure Laterality Date  . ABDOMINAL HYSTERECTOMY    . CHOLECYSTECTOMY    . IR GENERIC HISTORICAL  05/04/2016   IR ANGIO INTRA EXTRACRAN SEL COM CAROTID INNOMINATE UNI R MOD SED 05/04/2016 Luanne Bras, MD [facility]  . IR GENERIC HISTORICAL  05/04/2016   IR ANGIO VERTEBRAL SEL VERTEBRAL UNI L MOD SED 05/04/2016 Luanne Bras, MD [facility]  . IR GENERIC HISTORICAL  05/04/2016   IR PERCUTANEOUS ART THROMBECTOMY/INFUSION INTRACRANIAL INC DIAG ANGIO 05/04/2016 Luanne Bras, MD [facility]  . IR GENERIC HISTORICAL  06/22/2016   IR RADIOLOGIST EVAL & MGMT 06/22/2016 [facility]  . RADIOLOGY WITH ANESTHESIA N/A 05/04/2016   Procedure: RADIOLOGY WITH ANESTHESIA;  Surgeon: Luanne Bras,  MD;  Location: [facility];  Service: Radiology;  Laterality: N/A;    OB History    No data available       Home Medications    Prior to Admission medications   Medication Sig Start Date End Date Taking? Authorizing Provider  acetaminophen (TYLENOL) 500 MG tablet Take 500 mg by mouth every 6 (six) hours as needed.   Yes [provider]  B Complex-C (SUPER B COMPLEX PO) Take 1 tablet by mouth daily.    Yes [provider]  CALCIUM PO Take 1 tablet by mouth daily.    Yes [provider]  Cholecalciferol (VITAMIN D-3) 1000 units CAPS Take 1,000 Units by mouth daily.    Yes [provider]  diltiazem (CARDIZEM CD) 120 MG 24 hr capsule Take 120 mg by mouth daily.   Yes [provider]  ELIQUIS 5 MG TABS tablet TAKE ONE TABLET BY MOUTH TWICE DAILY 01/11/17  Yes Sherran Needs, NP  hydrochlorothiazide (HYDRODIURIL) 25 MG tablet Take 25 mg by mouth daily. 04/18/17  Yes [provider]  JANUVIA 50 MG tablet Take 50 mg by mouth daily.  11/04/16  Yes [provider]  losartan (COZAAR) 50 MG tablet Take 50 mg by mouth daily. 04/11/17  Yes [provider]  meloxicam (MOBIC) 15 MG tablet TAKE 1 TABLET BY MOUTH ONCE DAILY 01/25/17  Yes Edrick Kins, DPM  rosuvastatin (CRESTOR) 10 MG tablet Take 10 mg by mouth daily.   Yes  [provider]  meloxicam (MOBIC) 15 MG tablet Take 15 mg by mouth daily.  10/07/16   [provider]  potassium chloride (K-DUR) 10 MEQ tablet Take 2 tablets (20 mEq total) by mouth daily. 05/18/17   Charlesetta Shanks, MD    Family History Family History  Problem Relation Age of Onset  . Stroke Mother   . Breast cancer Mother   . Stroke Father     Social History Social History   Tobacco Use  . Smoking status: Former Smoker    Last attempt to quit: 10/21/2013    Years since quitting: 3.5  . Smokeless tobacco: Never Used  . Tobacco comment: Quit 2 years ago - uses [employer]  Substance Use  Topics  . Alcohol use: No    Alcohol/week: 0.0 oz  . Drug use: No     Allergies   Latex and Xarelto [rivaroxaban]   Review of Systems Review of Systems 10 Systems reviewed and are negative for acute change except as noted in the HPI.   Physical Exam Updated Vital Signs BP (!) 141/53   Pulse 71   Temp (!) 97.5 F (36.4 C) (Oral)   Resp 19   SpO2 98%   Physical Exam  Constitutional: She is oriented to person, place, and time. She appears well-developed and well-nourished. No distress.  HENT:  Head: Normocephalic and atraumatic.  Eyes: Conjunctivae and EOM are normal.  Neck: Neck supple.  Cardiovascular: Normal rate and regular rhythm.  No murmur heard. Pulmonary/Chest: Effort normal and breath sounds normal. No respiratory distress.  Abdominal: Soft. There is no tenderness.  Musculoskeletal: Normal range of motion. She exhibits no edema or tenderness.  Neurological: She is alert and oriented to person, place, and time. No cranial nerve deficit. She exhibits normal muscle tone. Coordination normal.  Skin: Skin is warm and dry.  Psychiatric: She has a normal mood and affect.  Nursing note and vitals reviewed.    ED Treatments / Results  Labs (all labs ordered are listed, but only abnormal results are displayed) Labs Reviewed  BASIC METABOLIC PANEL - Abnormal; Notable for the following components:      Result Value   Potassium 3.1 (*)    Chloride 100 (*)    Glucose, Bld 136 (*)    Calcium 7.2 (*)    All other components within normal limits  CBC - Abnormal; Notable for the following components:   Hemoglobin 11.0 (*)    HCT 34.5 (*)    MCH 25.9 (*)    All other components within normal limits  I-STAT TROPONIN, ED  I-STAT TROPONIN, ED    EKG  EKG Interpretation  Date/Time:  Wednesday May 18 2017 06:01:16 EST Ventricular Rate:  66 PR Interval:  216 QRS Duration: 90 QT Interval:  456 QTC Calculation: 478 R Axis:   64 Text Interpretation:  Sinus  rhythm with 1st degree A-V block Left ventricular hypertrophy with repolarization abnormality Abnormal ECG No longer in a fib otherwise, no acute change Confirmed by Thayer Jew [phone]) on 05/18/2017 6:06:05 AM       Radiology Dg Chest 2 View  Result Date: 05/18/2017 CLINICAL DATA:  Shortness of breath EXAM: CHEST  2 VIEW COMPARISON:  Chest radiograph 05/06/2016 FINDINGS: Bilateral basilar predominant peribronchial thickening, worsened from the prior study. Mild hyperinflation. No focal consolidation or pulmonary edema. No pleural effusion or pneumothorax. IMPRESSION: Bilateral basilar predominant peribronchial thickening, likely indicating chronic bronchitis. Electronically Signed   By: Cletus Gash.D.  On: 05/18/2017 06:23    Procedures Procedures (including critical care time)  Medications Ordered in ED Medications  nitroGLYCERIN (NITROSTAT) SL tablet 0.4 mg (not administered)  diltiazem (CARDIZEM CD) 24 hr capsule 120 mg (120 mg Oral Given 05/18/17 1045)  potassium chloride SA (K-DUR,KLOR-CON) CR tablet 40 mEq (40 mEq Oral Given 05/18/17 1151)     Initial Impression / Assessment and Plan / ED Course  I have reviewed the triage vital signs and the nursing notes.  Pertinent labs & imaging results that were available during my care of the patient were reviewed by me and considered in my medical decision making (see chart for details).     Final Clinical Impressions(s) / ED Diagnoses   Final diagnoses:  Shortness of breath  History of atrial fibrillation  Hypokalemia  Patient presents as outlined above.  She has had mild dyspnea this morning.  Cardiac enzymes are normal.  No signs of acute MI.  Patient does not show objective signs of congestive heart failure.  She has been up and ambulatory in the emergency department walking at a brisk pace without dyspnea.  No lower extremity edema or calf tenderness.  Patient is anticoagulated for paroxysmal A. fib.  Low suspicion for  PE.  2 sets of enzymes within normal limits, plan will be for close follow-up with PCP and cardiology.  Patient had mild hypokalemia.  Will prescribe for 20 mEq daily for the next 7 days.  Patient has been rate controlled in sinus rhythm with stable vital signs throughout her stay in the emergency department.  ED Discharge Orders        Ordered    potassium chloride (K-DUR) 10 MEQ tablet  Daily     05/18/17 1246       Charlesetta Shanks, MD 05/18/17 1247

## 2017-05-18 NOTE — Discharge Instructions (Addendum)
1.  Your potassium is mildly low.  Take extra potassium as prescribed for the next 5 days.  You will have to have a recheck on your potassium.  High potassium can be dangerous so this must be monitored closely. 2.  Schedule recheck with either your family doctor or cardiologist as soon as possible. 3.  Return to the emergency department if you have any recurrence of shortness of breath, chest pain, worsening or concerning symptoms.

## 2017-05-18 NOTE — ED Notes (Signed)
Heart Healthy diet breakfast tray ordered @ 0908.

## 2017-05-18 NOTE — ED Triage Notes (Signed)
Patient with shortness of breath that started when she awoke this morning at 0400.  She denies any chest pain at this time.  She states that it feels tight when she breathes.  No nausea or vomiting.

## 2017-05-20 ENCOUNTER — Telehealth (HOSPITAL_COMMUNITY): Payer: Self-pay | Admitting: *Deleted

## 2017-05-20 NOTE — Telephone Encounter (Signed)
LMOM for pt clbk to sched f/u visit from ED discharge

## 2017-06-01 DIAGNOSIS — E876 Hypokalemia: Secondary | ICD-10-CM | POA: Diagnosis not present

## 2017-06-01 DIAGNOSIS — R0609 Other forms of dyspnea: Secondary | ICD-10-CM | POA: Diagnosis not present

## 2017-06-01 DIAGNOSIS — E7849 Other hyperlipidemia: Secondary | ICD-10-CM | POA: Diagnosis not present

## 2017-06-01 DIAGNOSIS — I10 Essential (primary) hypertension: Secondary | ICD-10-CM | POA: Diagnosis not present

## 2017-06-01 DIAGNOSIS — I480 Paroxysmal atrial fibrillation: Secondary | ICD-10-CM | POA: Diagnosis not present

## 2017-06-01 DIAGNOSIS — E119 Type 2 diabetes mellitus without complications: Secondary | ICD-10-CM | POA: Diagnosis not present

## 2017-06-01 DIAGNOSIS — Z6829 Body mass index (BMI) 29.0-29.9, adult: Secondary | ICD-10-CM | POA: Diagnosis not present

## 2017-06-13 ENCOUNTER — Ambulatory Visit
Admission: RE | Admit: 2017-06-13 | Discharge: 2017-06-13 | Disposition: A | Discharge status: Home / Self Care | Payer: PPO | Source: Ambulatory Visit | Attending: Internal Medicine | Admitting: Internal Medicine

## 2017-06-13 DIAGNOSIS — N6489 Other specified disorders of breast: Secondary | ICD-10-CM

## 2017-06-13 DIAGNOSIS — N6012 Diffuse cystic mastopathy of left breast: Secondary | ICD-10-CM | POA: Diagnosis not present

## 2017-06-13 DIAGNOSIS — N6324 Unspecified lump in the left breast, lower inner quadrant: Secondary | ICD-10-CM | POA: Diagnosis not present

## 2017-06-14 ENCOUNTER — Other Ambulatory Visit: Payer: Self-pay | Admitting: Internal Medicine

## 2017-06-14 DIAGNOSIS — R0600 Dyspnea, unspecified: Secondary | ICD-10-CM | POA: Diagnosis not present

## 2017-06-14 DIAGNOSIS — N6489 Other specified disorders of breast: Secondary | ICD-10-CM

## 2017-06-14 DIAGNOSIS — I119 Hypertensive heart disease without heart failure: Secondary | ICD-10-CM | POA: Diagnosis not present

## 2017-06-14 DIAGNOSIS — E785 Hyperlipidemia, unspecified: Secondary | ICD-10-CM | POA: Diagnosis not present

## 2017-06-14 DIAGNOSIS — I480 Paroxysmal atrial fibrillation: Secondary | ICD-10-CM | POA: Diagnosis not present

## 2017-06-28 DIAGNOSIS — I480 Paroxysmal atrial fibrillation: Secondary | ICD-10-CM | POA: Diagnosis not present

## 2017-07-01 DIAGNOSIS — I4891 Unspecified atrial fibrillation: Secondary | ICD-10-CM | POA: Diagnosis not present

## 2017-07-01 DIAGNOSIS — I10 Essential (primary) hypertension: Secondary | ICD-10-CM | POA: Diagnosis not present

## 2017-07-01 DIAGNOSIS — R0602 Shortness of breath: Secondary | ICD-10-CM | POA: Diagnosis not present

## 2017-07-04 ENCOUNTER — Ambulatory Visit
Admission: RE | Admit: 2017-07-04 | Discharge: 2017-07-04 | Disposition: A | Discharge status: Home / Self Care | Payer: PPO | Source: Ambulatory Visit | Attending: Internal Medicine | Admitting: Internal Medicine

## 2017-07-04 DIAGNOSIS — N6489 Other specified disorders of breast: Secondary | ICD-10-CM

## 2017-07-04 DIAGNOSIS — C50312 Malignant neoplasm of lower-inner quadrant of left female breast: Secondary | ICD-10-CM | POA: Diagnosis not present

## 2017-07-05 ENCOUNTER — Other Ambulatory Visit: Payer: Self-pay | Admitting: Internal Medicine

## 2017-07-05 ENCOUNTER — Telehealth: Payer: Self-pay | Admitting: Oncology

## 2017-07-05 DIAGNOSIS — N63 Unspecified lump in unspecified breast: Secondary | ICD-10-CM

## 2017-07-05 NOTE — Telephone Encounter (Signed)
Spoke to patient to confirm morning [facility] appointment on 07/13/17, packet will be given by BCG

## 2017-07-06 ENCOUNTER — Encounter: Payer: Self-pay | Admitting: *Deleted

## 2017-07-07 ENCOUNTER — Ambulatory Visit
Admission: RE | Admit: 2017-07-07 | Discharge: 2017-07-07 | Disposition: A | Discharge status: Home / Self Care | Payer: PPO | Source: Ambulatory Visit | Attending: Internal Medicine | Admitting: Internal Medicine

## 2017-07-07 DIAGNOSIS — N63 Unspecified lump in unspecified breast: Secondary | ICD-10-CM

## 2017-07-07 DIAGNOSIS — C50912 Malignant neoplasm of unspecified site of left female breast: Secondary | ICD-10-CM | POA: Diagnosis not present

## 2017-07-12 ENCOUNTER — Other Ambulatory Visit: Payer: Self-pay | Admitting: *Deleted

## 2017-07-12 DIAGNOSIS — C50312 Malignant neoplasm of lower-inner quadrant of left female breast: Secondary | ICD-10-CM | POA: Insufficient documentation

## 2017-07-12 DIAGNOSIS — Z170 Estrogen receptor positive status [ER+]: Principal | ICD-10-CM

## 2017-07-12 NOTE — Progress Notes (Addendum)
Diana Cervantes  [phone] [phone]     ID: Diana Cervantes DOB: [birth year]  MR#: [id number]  ERX#:[id number]  Patient Care Team: Diana Hatchet, Cervantes as PCP - General (Internal Medicine) Cervantes, Diana Dad, Cervantes as Consulting Physician (Oncology) Diana Overall, Cervantes as Consulting Physician (General Surgery) Diana Rudd, Cervantes as Consulting Physician (Radiation Oncology) Diana Hick, Cervantes as Referring Physician (Cardiology) OTHER Cervantes:  CHIEF COMPLAINT: Estrogen receptor positive breast cancer  CURRENT TREATMENT: Awaiting definitive surgery-   HISTORY OF CURRENT ILLNESS: Diana Cervantes had routine screening mammography on 04/06/2017 at the breast center showing a possible change in the left breast. She underwent unilateral left diagnostic mammography with tomography and left breast ultrasonography at the breast center on 04/27/2017 showing: An irregular mass in the lower inner quadrant of the left breast measuring 0.8 cm.  There was no ultrasound correlate.  Ultrasound of the left axilla was benign.  On 06/13/2017 she underwent biopsy of the left breast lower inner quadrant lesion showing no evidence of malignancy ([id number]).  However a second biopsy of the left lower inner quadrant obtained 07/04/2017 showed ([id number]) and invasive ductal carcinoma, grade 1, estrogen receptor 95% positive, progesterone receptor 95% positive, both with strong staining intensity, with an MIB-1 of 1%, and no HER-2 amplification, the signals ratio being 1.71 and the number per cell 2.65.  The patient's subsequent history is as detailed below.  INTERVAL HISTORY: Diana Cervantes was evaluated in the multidisciplinary breast cancer clinic on 07/13/2017. Her case was also presented at the multidisciplinary breast cancer conference on the same day. At that time a preliminary plan was proposed: Breast conserving surgery with sentinel lymph node sampling, consideration of Oncotype  depending on tumor size, consideration of adjuvant radiation.  REVIEW OF SYSTEMS: Diana Cervantes is doing well. She reports her right side is still a little weaker than her left status post her stroke. She has a hard time writing and has not tried to learn how to write with her left hand. However, she reports good strength to her right arm. She denies any weakness to her right leg. She denies unusual headaches, visual changes, nausea, vomiting, or dizziness. There has been no unusual cough, phlegm production, or pleurisy. This been no change in bowel or bladder habits. She denies unexplained fatigue or unexplained weight loss, bleeding, rash, or fever. A detailed review of systems was otherwise noncontributory.   PAST MEDICAL HISTORY: Past Medical History:  Diagnosis Date  . Atrial fibrillation ([place])   . Cataract   . Diabetes mellitus without complication ([place])   . Hypertension   . Stroke ([place])    paralyzed right side    PAST SURGICAL HISTORY: Past Surgical History:  Procedure Laterality Date  . ABDOMINAL HYSTERECTOMY    . CHOLECYSTECTOMY    . IR GENERIC HISTORICAL  05/04/2016   IR ANGIO INTRA EXTRACRAN SEL COM CAROTID INNOMINATE UNI R MOD SED 05/04/2016 Diana Cervantes [facility]  . IR GENERIC HISTORICAL  05/04/2016   IR ANGIO VERTEBRAL SEL VERTEBRAL UNI L MOD SED 05/04/2016 Diana Cervantes [facility]  . IR GENERIC HISTORICAL  05/04/2016   IR PERCUTANEOUS ART THROMBECTOMY/INFUSION INTRACRANIAL INC DIAG ANGIO 05/04/2016 Diana Cervantes [facility]  . IR GENERIC HISTORICAL  06/22/2016   IR RADIOLOGIST EVAL & MGMT 06/22/2016 [facility]  . RADIOLOGY WITH ANESTHESIA N/A 05/04/2016   Procedure: RADIOLOGY WITH ANESTHESIA;  Surgeon: Diana Cervantes;  Location: [facility];  Service: Radiology;  Laterality: N/A;  FAMILY HISTORY Family History  Problem Relation Age of Onset  . Stroke Mother   . Breast cancer Mother   . Stroke Father   The patient's father died at 50 years  old from a stroke. Her mother was diagnosed with breast cancer at 75 years old. She died at the age of 29 from a stroke. The patient does not have any brothers or sisters. She is unaware of anyone else in her family that had breast cancer.  GYNECOLOGIC HISTORY:  No LMP recorded. Patient has had a hysterectomy. Menarche: 77 years old Age at first live birth: 77 years old [place] P 4 LMP before 2000 Contraceptive none HRT no  Hysterectomy? 2000 SO? 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 She lives with her daughter, Diana Cervantes, 51, and Diana Cervantes's husband. 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 Knox has three sons as well. Diana Cervantes, 52, lives in [place] and works at [employer]. Diana Cervantes, 88, lives in [place], [place], and is on disability. Her youngest, Diana Cervantes, 34, lives in [place], [place], and he is a Training and development officer. The patient has 10 grandchildren and >10 great-grandchildren. She does not attend church.    ADVANCED DIRECTIVES: Not in place; at the 07/13/2017 visit the patient was given the appropriate forms to complete and notarized at her discretion   HEALTH MAINTENANCE: Social History   Tobacco Use  . Smoking status: Former Smoker    Last attempt to quit: 10/21/2013    Years since quitting: 3.7  . Smokeless tobacco: Never Used  . Tobacco comment: Quit 2 years ago - uses [employer]ig[employer]  Substance Use Topics  . Alcohol use: No    Alcohol/week: 0.0 oz  . Drug use: No     Colonoscopy: 2009 at [facility]  PAP:  Bone density: 2018, some bone loss   Allergies  Allergen Reactions  . Latex Other (See Comments)    Reaction??  . Xarelto [Rivaroxaban] Other (See Comments)    Made patient "not feel like herself"    Current Outpatient Medications  Medication Sig Dispense Refill  . B Complex-C (SUPER B COMPLEX PO) Take 1 tablet by mouth daily.     Marland Kitchen CALCIUM PO Take 1 tablet by mouth daily.     . Cholecalciferol (VITAMIN D-3) 1000 units CAPS Take 1,000  Units by mouth daily.     Marland Kitchen diltiazem (CARDIZEM CD) 120 MG 24 hr capsule Take 120 mg by mouth daily.    Marland Kitchen ELIQUIS 5 MG TABS tablet TAKE ONE TABLET BY MOUTH TWICE DAILY 60 tablet 6  . hydrochlorothiazide (HYDRODIURIL) 25 MG tablet Take 25 mg by mouth daily.    Marland Kitchen JANUVIA 50 MG tablet Take 50 mg by mouth daily.     Marland Kitchen losartan (COZAAR) 50 MG tablet Take 50 mg by mouth daily.    . meloxicam (MOBIC) 15 MG tablet Take 15 mg by mouth daily.     . rosuvastatin (CRESTOR) 10 MG tablet Take 10 mg by mouth daily.    Marland Kitchen acetaminophen (TYLENOL) 500 MG tablet Take 500 mg by mouth every 6 (six) hours as needed.    . potassium chloride (K-DUR) 10 MEQ tablet Take 2 tablets (20 mEq total) by mouth daily. (Patient not taking: Reported on 07/13/2017) 14 tablet 0   Current Facility-Administered Medications  Medication Dose Route Frequency Provider Last Rate Last Dose  . betamethasone acetate-betamethasone sodium phosphate (CELESTONE) injection 3 mg  3 mg Intramuscular Once Evans,  Dorathy Daft, DPM        OBJECTIVE: Older white woman in no acute distress  Vitals:   07/13/17 0900  BP: 140/90  Pulse: (!) 103  Resp: 18  Temp: 98.6 F (37 C)  SpO2: 99%     Body mass index is 29.21 kg/m.   Wt Readings from Last 3 Encounters:  07/13/17 159 lb 11.2 oz (72.4 kg)  11/18/16 159 lb 9.6 oz (72.4 kg)  09/23/16 160 lb 9.6 oz (72.8 kg)      ECOG FS:1 - Symptomatic but completely ambulatory  Ocular: Sclerae unicteric, EOMs intact Lymphatic: No cervical or supraclavicular adenopathy Lungs no rales or rhonchi Heart irregular rate and rhythm Abd soft, obese, nontender, positive bowel sounds MSK no focal spinal tenderness, no joint edema Neuro: well-oriented, appropriate affect Breasts: The right breast is unremarkable.  The left breast is status post recent biopsy.  There is a mild ecchymosis.  I do not palpate a well-defined mass.  Both axillae are benign   LAB RESULTS:  CMP     Component Value Date/Time   NA 143  07/13/2017 0839   K 3.9 07/13/2017 0839   CL 104 07/13/2017 0839   CO2 29 07/13/2017 0839   GLUCOSE 108 07/13/2017 0839   BUN 14 07/13/2017 0839   CREATININE 0.81 07/13/2017 0839   CALCIUM 8.0 (L) 07/13/2017 0839   PROT 7.2 07/13/2017 0839   ALBUMIN 4.0 07/13/2017 0839   AST 27 07/13/2017 0839   ALT 14 07/13/2017 0839   ALKPHOS 92 07/13/2017 0839   BILITOT 0.7 07/13/2017 0839   GFRNONAA >60 07/13/2017 0839   GFRAA >60 07/13/2017 0839    No results found for: TOTALPROTELP, ALBUMINELP, A1GS, A2GS, BETS, BETA2SER, GAMS, MSPIKE, SPEI  No results found for: KPAFRELGTCHN, LAMBDASER, KAPLAMBRATIO  Lab Results  Component Value Date   WBC 7.1 07/13/2017   NEUTROABS 4.7 07/13/2017   HGB 11.0 (L) 05/18/2017   HCT 35.3 07/13/2017   MCV 81.1 07/13/2017   PLT 230 07/13/2017    '@LASTCHEMISTRY' @  No results found for: LABCA2  No components found for: THYHOO875  No results for input(s): INR in the last 168 hours.  No results found for: LABCA2  No results found for: ZVJ282  No results found for: SUO156  No results found for: FBP794  No results found for: CA2729  No components found for: HGQUANT  No results found for: CEA1 / No results found for: CEA1   No results found for: AFPTUMOR  No results found for: CHROMOGRNA  No results found for: PSA1  Appointment on 07/13/2017  Component Date Value Ref Range Status  . Sodium 07/13/2017 143  136 - 145 mmol/L Final  . Potassium 07/13/2017 3.9  3.5 - 5.1 mmol/L Final  . Chloride 07/13/2017 104  98 - 109 mmol/L Final  . CO2 07/13/2017 29  22 - 29 mmol/L Final  . Glucose, Bld 07/13/2017 108  70 - 140 mg/dL Final  . BUN 07/13/2017 14  7 - 26 mg/dL Final  . Creatinine 07/13/2017 0.81  0.60 - 1.10 mg/dL Final  . Calcium 07/13/2017 8.0* 8.4 - 10.4 mg/dL Final  . Total Protein 07/13/2017 7.2  6.4 - 8.3 g/dL Final  . Albumin 07/13/2017 4.0  3.5 - 5.0 g/dL Final  . AST 07/13/2017 27  5 - 34 U/L Final  . ALT 07/13/2017 14  0 - 55  U/L Final  . Alkaline Phosphatase 07/13/2017 92  40 - 150 U/L Final  . Total Bilirubin  07/13/2017 0.7  0.2 - 1.2 mg/dL Final  . GFR, Est Non Af Am 07/13/2017 >60  >60 mL/min Final  . GFR, Est AFR Am 07/13/2017 >60  >60 mL/min Final   Comment: (NOTE) The eGFR has been calculated using the CKD EPI equation. This calculation has not been validated in all clinical situations. eGFR's persistently <60 mL/min signify possible Chronic Kidney Disease.   Georgiann Hahn gap 07/13/2017 10  3 - 11 Final   Performed at [facility], [place] [address] [place], [place] [address]  . WBC Count 07/13/2017 7.1  3.9 - 10.3 K/uL Final  . RBC 07/13/2017 4.35  3.70 - 5.45 MIL/uL Final  . Hemoglobin 07/13/2017 11.3* 11.6 - 15.9 g/dL Final  . HCT 07/13/2017 35.3  34.8 - 46.6 % Final  . MCV 07/13/2017 81.1  79.5 - 101.0 fL Final  . MCH 07/13/2017 26.0  25.1 - 34.0 pg Final  . MCHC 07/13/2017 32.0  31.5 - 36.0 g/dL Final  . RDW 07/13/2017 14.7* 11.2 - 14.5 % Final  . Platelet Count 07/13/2017 230  145 - 400 K/uL Final  . Neutrophils Relative % 07/13/2017 66  % Final  . Neutro Abs 07/13/2017 4.7  1.5 - 6.5 K/uL Final  . Lymphocytes Relative 07/13/2017 21  % Final  . Lymphs Abs 07/13/2017 1.5  0.9 - 3.3 K/uL Final  . Monocytes Relative 07/13/2017 7  % Final  . Monocytes Absolute 07/13/2017 0.5  0.1 - 0.9 K/uL Final  . Eosinophils Relative 07/13/2017 5  % Final  . Eosinophils Absolute 07/13/2017 0.4  0.0 - 0.5 K/uL Final  . Basophils Relative 07/13/2017 1  % Final  . Basophils Absolute 07/13/2017 0.0  0.0 - 0.1 K/uL Final   Performed at [facility], [address] [place], [place] [address]    (this displays the last labs from the last 3 days)  No results found for: TOTALPROTELP, ALBUMINELP, A1GS, A2GS, BETS, BETA2SER, GAMS, MSPIKE, SPEI (this displays SPEP labs)  No results found for: KPAFRELGTCHN, LAMBDASER, KAPLAMBRATIO (kappa/lambda light chains)  No results  found for: HGBA, HGBA2QUANT, HGBFQUANT, HGBSQUAN (Hemoglobinopathy evaluation)   No results found for: LDH  No results found for: IRON, TIBC, IRONPCTSAT (Iron and TIBC)  No results found for: FERRITIN  Urinalysis    Component Value Date/Time   COLORURINE YELLOW 05/04/2016 2231   APPEARANCEUR CLEAR 05/04/2016 2231   LABSPEC 1.016 05/04/2016 2231   PHURINE 5.0 05/04/2016 2231   GLUCOSEU NEGATIVE 05/04/2016 2231   HGBUR SMALL (A) 05/04/2016 2231   BILIRUBINUR NEGATIVE 05/04/2016 2231   KETONESUR NEGATIVE 05/04/2016 2231   PROTEINUR NEGATIVE 05/04/2016 2231   NITRITE NEGATIVE 05/04/2016 2231   LEUKOCYTESUR TRACE (A) 05/04/2016 2231     STUDIES: [place] Axilla Left  Result Date: 07/07/2017 CLINICAL DATA:  The 77 year old female with a recently diagnosed with cancer of the left breast presenting for ultrasound of the left axilla. EXAM: ULTRASOUND OF THE LEFT AXILLA COMPARISON:  Prior exams. FINDINGS: Ultrasound targeted to the left axilla demonstrates multiple normal-appearing lymph nodes. IMPRESSION: Normal left axillary ultrasound. RECOMMENDATION: Continue treatment plan for known left breast carcinoma. I have discussed the findings and recommendations with the patient. Results were also provided in writing at the conclusion of the visit. If applicable, a reminder letter will be sent to the patient regarding the next appointment. BI-RADS CATEGORY  1: Negative. Electronically Signed   By: Ammie Ferrier M.D.   On: 07/07/2017 10:08   Mm Clip  Placement Left  Result Date: 07/05/2017 CLINICAL DATA:  Post biopsy mammogram of the left breast for clip placement. EXAM: DIAGNOSTIC LEFT MAMMOGRAM POST STEREOTACTIC BIOPSY COMPARISON:  Previous exam(s). FINDINGS: Mammographic images were obtained following stereotactic guided biopsy of the left breast. The X shaped biopsy marking clip is positioned in the lower-inner quadrant of the left breast at the site of the biopsied asymmetry. IMPRESSION: The X  shaped biopsy marking clip is placed in the lower-inner quadrant, at the site of the stereotactically biopsied asymmetry. Final Assessment: Post Procedure Mammograms for Marker Placement Electronically Signed   By: Ammie Ferrier M.D.   On: 07/05/2017 15:52   Mm Clip Placement Left  Result Date: 06/14/2017 CLINICAL DATA:  Status post stereotactic guided core biopsy of left breast mass. EXAM: DIAGNOSTIC LEFT MAMMOGRAM POST STEREOTACTIC BIOPSY COMPARISON:  Previous exam(s). FINDINGS: Mammographic images were obtained following stereotactic guided biopsy of mass in the lower inner quadrant of the left breast. A coil shaped clip is identified in the upper inner quadrant of the left breast following biopsy, likely representing a discrete area of asymmetry. An area of asymmetry persists unchanged in the lower portion of the left breast, best seen on the true lateral projection and warranting tissue diagnosis. IMPRESSION: 1. Coil shaped tissue marker clip within the upper inner quadrant following biopsy of intended target in the lower inner quadrant, compatible with a separate area of asymmetry in the medial portion of the breast. 2. A 2nd area concern in the lower portion of the breast also warrants tissue diagnosis. Stereotactic guided core biopsy will be scheduled for the patient. Final Assessment: Post Procedure Mammograms for Marker Placement Electronically Signed   By: Nolon Nations M.D.   On: 06/14/2017 10:36   Mm Lt Breast Bx W Loc Dev 1st Lesion Image Bx Spec Stereo Guide  Addendum Date: 07/07/2017   ADDENDUM REPORT: 07/06/2017 14:43 ADDENDUM: Pathology revealed GRADE I INVASIVE MAMMARY CARCINOMA of the Left breast, lower inner quadrant. E-cadherin is positive supporting a ductal origin. This was found to be concordant by Dr. Ammie Ferrier. Pathology results were discussed with the patient by telephone. The patient reported doing well after the biopsy with tenderness at the site. Post biopsy  instructions and care were reviewed and questions were answered. The patient was encouraged to call [facility] for any additional concerns. The patient was referred to [facility] at [facility] on July 13, 2017. The patient is scheduled for a Left axillary ultrasound on July 07, 2017 at [facility] Pathology results reported by Terie Purser, RN on 07/06/2017. Electronically Signed   By: Ammie Ferrier M.D.   On: 07/06/2017 14:43   Result Date: 07/07/2017 CLINICAL DATA:  77 year old female presenting for stereotactic biopsy of an asymmetry in the left breast. EXAM: LEFT BREAST STEREOTACTIC CORE NEEDLE BIOPSY COMPARISON:  Previous exams. FINDINGS: The patient and I discussed the procedure of stereotactic-guided biopsy including benefits and alternatives. We discussed the high likelihood of a successful procedure. We discussed the risks of the procedure including infection, bleeding, tissue injury, clip migration, and inadequate sampling. Informed written consent was given. The usual time out protocol was performed immediately prior to the procedure. Using sterile technique and 1% Lidocaine as local anesthetic, under stereotactic guidance, a 9 gauge vacuum assisted device was used to perform core needle biopsy of inferior left breast using a lateral approach. Lesion quadrant: Lower-inner quadrant At the conclusion of the procedure, a  X shaped tissue marker clip was deployed into the biopsy cavity. Follow-up 2-view mammogram was performed and dictated separately. IMPRESSION: Stereotactic-guided biopsy of an asymmetry in the lower-inner left breast. No apparent complications. Electronically Signed: By: Ammie Ferrier M.D. On: 07/04/2017 11:53   Mm Lt Breast Bx W Loc Dev 1st Lesion Image Bx Spec Stereo Guide  Addendum Date: 06/14/2017   ADDENDUM REPORT: 06/14/2017 15:13 ADDENDUM: Pathology revealed FIBROCYSTIC  CHANGES of the Left breast, lower inner quadrant, (coil clip). This was found to be concordant by Dr. Nolon Nations. Pathology results were discussed with the patient by telephone. The patient reported doing well after the biopsy with tenderness at the site. Post biopsy instructions and care were reviewed and questions were answered. The patient was encouraged to call [facility] for any additional concerns. The patient is scheduled for stereotatic biopsy of a second site on July 04, 2017 at [facility]. Pathology results reported by Terie Purser, RN on 06/14/2017. Electronically Signed   By: Nolon Nations M.D.   On: 06/14/2017 15:13   Result Date: 06/14/2017 CLINICAL DATA:  Patient presents for stereotactic guided core biopsy of mass in the lower inner quadrant of the left breast. EXAM: LEFT BREAST STEREOTACTIC CORE NEEDLE BIOPSY COMPARISON:  Previous exams. FINDINGS: The patient and I discussed the procedure of stereotactic-guided biopsy including benefits and alternatives. We discussed the high likelihood of a successful procedure. We discussed the risks of the procedure including infection, bleeding, tissue injury, clip migration, and inadequate sampling. Informed written consent was given. The usual time out protocol was performed immediately prior to the procedure. Using sterile technique and 1% Lidocaine as local anesthetic, under stereotactic guidance, a 9 gauge vacuum assisted device was used to perform core needle biopsy of mass in the lower inner quadrant of the left breast using a superior to inferior approach. Lesion quadrant: Lower inner left breast At the conclusion of the procedure, a coil shaped tissue marker clip was deployed into the biopsy cavity. Follow-up 2-view mammogram was performed and dictated separately. IMPRESSION: Stereotactic-guided biopsy of left breast mass. No apparent complications. Electronically Signed: By: Nolon Nations M.D. On:  06/13/2017 10:13    ELIGIBLE FOR AVAILABLE RESEARCH PROTOCOL: No  ASSESSMENT: 77 y.o. [place] woman status post left breast lower inner quadrant biopsy 07/04/2017 for a clinical T1b N0, stage IA invasive ductal carcinoma, grade 1, estrogen and progesterone receptor positive, HER-2 not amplified, with an MIB-1 of 1%  (1) definitive surgery pending  (2) consider Oncotype if tumor proved to be greater than 0.5 cm: Chemotherapy not anticipated  (3) consider adjuvant radiation  (4) antiestrogens to follow at the completion of local treatment  PLAN: We spent the better part of today's hour-long appointment discussing the biology of her diagnosis and the specifics of her situation. We first reviewed the fact that cancer is not one disease but more than 100 different diseases and that it is important to keep them separate-- otherwise when friends and relatives discuss their own cancer experiences with Joaquina confusion can result. Similarly we explained that if breast cancer spreads to the bone or liver, the patient would not have bone cancer or liver cancer, but breast cancer in the bone and breast cancer in the liver: one cancer in three places-- not 3 different cancers which otherwise would have to be treated in 3 different ways.  We discussed the difference between local and systemic therapy. In terms of loco-regional treatment, lumpectomy plus radiation is equivalent  to mastectomy as far as survival is concerned. For this reason, and because the cosmetic results are generally superior, we recommend breast conserving surgery.   We then discussed the rationale for systemic therapy. There is some risk that this cancer may have already spread to other parts of her body. Patients frequently ask at this point about bone scans, CAT scans and PET scans to find out if they have occult breast cancer somewhere else. The problem is that in early stage disease we are much more likely to find false positives  then true cancers and this would expose the patient to unnecessary procedures as well as unnecessary radiation. Scans cannot answer the question the patient really would like to know, which is whether she has microscopic disease elsewhere in her body. For those reasons we do not recommend them.  Of course we would proceed to aggressive evaluation of any symptoms that might suggest metastatic disease, but that is not the case here.  Next we went over the options for systemic therapy which are anti-estrogens, anti-HER-2 immunotherapy, and chemotherapy. Merranda does not meet criteria for anti-HER-2 immunotherapy. She is a good candidate for anti-estrogens.  The question of chemotherapy is more complicated. Chemotherapy is most effective in rapidly growing, aggressive tumors. It is much less effective in low-grade, slow growing cancers, like Lilymae 's. For that reason we may request an Oncotype from the definitive surgical sample, as suggested by NCCN guidelines.  However if the tumor proved to be as small as it appears, we may instead forego the Oncotype root and simply move from surgery to consideration of radiation.  At this point my expectation is the benefit of chemotherapy if any on this patient would be minimal  The Cervantes plan then is to start with surgery, consider Oncotype, consider radiation, and then Ms. Rennaker will return to see me to discuss antiestrogens, which in her case will be anastrozole given her history of prior stroke  Ghina has a good understanding of the Cervantes plan. She agrees with it. She knows the goal of treatment in her case is cure. She will call with any problems that may develop before her next visit here.  Cervantes, Diana Dad, Cervantes  07/13/17 9:47 AM Medical Oncology and Hematology [facility] [address] [place], [place] [address] Tel. [phone]    Fax. [phone]  This document serves as a record of services personally performed by Chauncey Cruel, Cervantes. It was created on his behalf by Margit Banda, a trained medical scribe. The creation of this record is based on the scribe's personal observations and the provider's statements to them.   I have reviewed the above documentation for accuracy and completeness, and I agree with the above.

## 2017-07-13 ENCOUNTER — Inpatient Hospital Stay: Payer: PPO | Attending: Oncology | Admitting: Oncology

## 2017-07-13 ENCOUNTER — Ambulatory Visit
Admission: RE | Admit: 2017-07-13 | Discharge: 2017-07-13 | Disposition: A | Discharge status: Home / Self Care | Payer: PPO | Source: Ambulatory Visit | Attending: Radiation Oncology | Admitting: Radiation Oncology

## 2017-07-13 ENCOUNTER — Inpatient Hospital Stay: Payer: PPO

## 2017-07-13 ENCOUNTER — Encounter: Payer: Self-pay | Admitting: Oncology

## 2017-07-13 ENCOUNTER — Encounter: Payer: Self-pay | Admitting: *Deleted

## 2017-07-13 ENCOUNTER — Other Ambulatory Visit: Payer: Self-pay

## 2017-07-13 ENCOUNTER — Ambulatory Visit: Payer: PPO | Attending: Surgery | Admitting: Physical Therapy

## 2017-07-13 ENCOUNTER — Encounter: Payer: Self-pay | Admitting: Physical Therapy

## 2017-07-13 VITALS — BP 140/90 | HR 103 | Temp 98.6°F | Resp 18 | Ht 62.0 in | Wt 159.7 lb

## 2017-07-13 DIAGNOSIS — Z90710 Acquired absence of both cervix and uterus: Secondary | ICD-10-CM | POA: Diagnosis not present

## 2017-07-13 DIAGNOSIS — C50312 Malignant neoplasm of lower-inner quadrant of left female breast: Secondary | ICD-10-CM | POA: Diagnosis not present

## 2017-07-13 DIAGNOSIS — Z170 Estrogen receptor positive status [ER+]: Principal | ICD-10-CM

## 2017-07-13 DIAGNOSIS — Z79899 Other long term (current) drug therapy: Secondary | ICD-10-CM | POA: Diagnosis not present

## 2017-07-13 DIAGNOSIS — Z7901 Long term (current) use of anticoagulants: Secondary | ICD-10-CM | POA: Diagnosis not present

## 2017-07-13 DIAGNOSIS — Z87891 Personal history of nicotine dependence: Secondary | ICD-10-CM | POA: Diagnosis not present

## 2017-07-13 DIAGNOSIS — Z803 Family history of malignant neoplasm of breast: Secondary | ICD-10-CM | POA: Diagnosis not present

## 2017-07-13 DIAGNOSIS — N6320 Unspecified lump in the left breast, unspecified quadrant: Secondary | ICD-10-CM | POA: Diagnosis not present

## 2017-07-13 DIAGNOSIS — I482 Chronic atrial fibrillation, unspecified: Secondary | ICD-10-CM

## 2017-07-13 DIAGNOSIS — I69831 Monoplegia of upper limb following other cerebrovascular disease affecting right dominant side: Secondary | ICD-10-CM | POA: Insufficient documentation

## 2017-07-13 DIAGNOSIS — R531 Weakness: Secondary | ICD-10-CM | POA: Diagnosis not present

## 2017-07-13 DIAGNOSIS — R293 Abnormal posture: Secondary | ICD-10-CM | POA: Diagnosis not present

## 2017-07-13 DIAGNOSIS — I10 Essential (primary) hypertension: Secondary | ICD-10-CM | POA: Diagnosis not present

## 2017-07-13 DIAGNOSIS — C50912 Malignant neoplasm of unspecified site of left female breast: Secondary | ICD-10-CM | POA: Diagnosis not present

## 2017-07-13 DIAGNOSIS — E119 Type 2 diabetes mellitus without complications: Secondary | ICD-10-CM | POA: Insufficient documentation

## 2017-07-13 DIAGNOSIS — I63312 Cerebral infarction due to thrombosis of left middle cerebral artery: Secondary | ICD-10-CM

## 2017-07-13 LAB — CBC WITH DIFFERENTIAL (CANCER CENTER ONLY)
BASOS ABS: 0 10*3/uL (ref 0.0–0.1)
Basophils Relative: 1 %
EOS ABS: 0.4 10*3/uL (ref 0.0–0.5)
EOS PCT: 5 %
HCT: 35.3 % (ref 34.8–46.6)
HEMOGLOBIN: 11.3 g/dL — AB (ref 11.6–15.9)
LYMPHS PCT: 21 %
Lymphs Abs: 1.5 10*3/uL (ref 0.9–3.3)
MCH: 26 pg (ref 25.1–34.0)
MCHC: 32 g/dL (ref 31.5–36.0)
MCV: 81.1 fL (ref 79.5–101.0)
Monocytes Absolute: 0.5 10*3/uL (ref 0.1–0.9)
Monocytes Relative: 7 %
NEUTROS PCT: 66 %
Neutro Abs: 4.7 10*3/uL (ref 1.5–6.5)
PLATELETS: 230 10*3/uL (ref 145–400)
RBC: 4.35 MIL/uL (ref 3.70–5.45)
RDW: 14.7 % — ABNORMAL HIGH (ref 11.2–14.5)
WBC: 7.1 10*3/uL (ref 3.9–10.3)

## 2017-07-13 LAB — CMP (CANCER CENTER ONLY)
ALT: 14 U/L (ref 0–55)
AST: 27 U/L (ref 5–34)
Albumin: 4 g/dL (ref 3.5–5.0)
Alkaline Phosphatase: 92 U/L (ref 40–150)
Anion gap: 10 (ref 3–11)
BILIRUBIN TOTAL: 0.7 mg/dL (ref 0.2–1.2)
BUN: 14 mg/dL (ref 7–26)
CO2: 29 mmol/L (ref 22–29)
CREATININE: 0.81 mg/dL (ref 0.60–1.10)
Calcium: 8 mg/dL — ABNORMAL LOW (ref 8.4–10.4)
Chloride: 104 mmol/L (ref 98–109)
Glucose, Bld: 108 mg/dL (ref 70–140)
POTASSIUM: 3.9 mmol/L (ref 3.5–5.1)
Sodium: 143 mmol/L (ref 136–145)
TOTAL PROTEIN: 7.2 g/dL (ref 6.4–8.3)

## 2017-07-13 NOTE — Patient Instructions (Signed)

## 2017-07-13 NOTE — Progress Notes (Signed)
Clinical Social Work [place] Psychosocial Distress Screening [place]  Patient completed distress screening protocol and scored a 1 on the Psychosocial Distress Thermometer which indicates mild distress. Clinical Social Worker met with patient in [facility] to assess for distress and other psychosocial needs. Patient stated she was feeling overwhelmed but felt "better" after meeting with the treatment team and getting more information on her treatment plan. CSW and patient discussed common feeling and emotions when being diagnosed with cancer, and the importance of support during treatment. CSW informed patient of the support team and support services at [facility]. CSW provided contact information and encouraged patient to call with any questions or concerns.  ONCBCN DISTRESS SCREENING 07/13/2017  Screening Type Initial Screening  Distress experienced in past week (1-10) 1  Information Concerns Type Lack of info about diagnosis;Lack of info about treatment;Lack of info about complementary therapy choices     Johnnye Lana, MSW, LCSW, OSW-C Clinical Social Worker [facility] [phone]

## 2017-07-13 NOTE — Progress Notes (Signed)
Radiation Oncology         [phone] ________________________________  Name: Diana Cervantes        MRN: [id number]  Date of Service: 07/13/2017 DOB: 07/05/1940  CC:Velna Hatchet, MD  Alphonsa Overall, MD     REFERRING PHYSICIAN: Alphonsa Overall, MD   DIAGNOSIS: The encounter diagnosis was Malignant neoplasm of lower-inner quadrant of left breast in female, estrogen receptor positive ([place]).   HISTORY OF PRESENT ILLNESS: Diana Cervantes is a 77 y.o. female seen in the multidisciplinary breast clinic for a new diagnosis of left breast cancer. The patient was noted to have a screening asymmetry in the left breast and diagnostic imaging revealed an 8 mm mass in the lower inner quadrant of the breast. A second mass in the lower portion of the left breast which was approximately 5 mm was also noted. There was no ultrasound correlate, and her axilla was negative for adenopathy. She had biopsies of each site, and the 8 mm lesion was negative for malignancy. Her biopsy of the other lesion that was approximately 5 mm, revealed a grade 1, invasive ductal carcinoma, ER/PR positive, HER2 negative, with a Ki 67 of 1%. She comes today to discuss options of treatment of her breast cancer.   PREVIOUS RADIATION THERAPY: No   PAST MEDICAL HISTORY:  Past Medical History:  Diagnosis Date  . Cataract   . Diabetes mellitus without complication ([place])   . Hypertension        PAST SURGICAL HISTORY: Past Surgical History:  Procedure Laterality Date  . ABDOMINAL HYSTERECTOMY    . CHOLECYSTECTOMY    . IR GENERIC HISTORICAL  05/04/2016   IR ANGIO INTRA EXTRACRAN SEL COM CAROTID INNOMINATE UNI R MOD SED 05/04/2016 Luanne Bras, MD [facility]  . IR GENERIC HISTORICAL  05/04/2016   IR ANGIO VERTEBRAL SEL VERTEBRAL UNI L MOD SED 05/04/2016 Luanne Bras, MD [facility]  . IR GENERIC HISTORICAL  05/04/2016   IR PERCUTANEOUS ART THROMBECTOMY/INFUSION INTRACRANIAL INC DIAG ANGIO 05/04/2016 Luanne Bras, MD [facility]  . IR GENERIC HISTORICAL  06/22/2016   IR RADIOLOGIST EVAL & MGMT 06/22/2016 [facility]  . RADIOLOGY WITH ANESTHESIA N/A 05/04/2016   Procedure: RADIOLOGY WITH ANESTHESIA;  Surgeon: Luanne Bras, MD;  Location: [facility];  Service: Radiology;  Laterality: N/A;     FAMILY HISTORY:  Family History  Problem Relation Age of Onset  . Stroke Mother   . Breast cancer Mother   . Stroke Father      SOCIAL HISTORY:  reports that she quit smoking about 3 years ago. she has never used smokeless tobacco. She reports that she does not drink alcohol or use drugs.The patient is divorced and lives in [place]. She has 4 adult children and recently retired from working in [employer].   ALLERGIES: Latex and Xarelto [rivaroxaban]   MEDICATIONS:  Current Outpatient Medications  Medication Sig Dispense Refill  . acetaminophen (TYLENOL) 500 MG tablet Take 500 mg by mouth every 6 (six) hours as needed.    . B Complex-C (SUPER B COMPLEX PO) Take 1 tablet by mouth daily.     Marland Kitchen CALCIUM PO Take 1 tablet by mouth daily.     . Cholecalciferol (VITAMIN D-3) 1000 units CAPS Take 1,000 Units by mouth daily.     Marland Kitchen diltiazem (CARDIZEM CD) 120 MG 24 hr capsule Take 120 mg by mouth daily.    Marland Kitchen ELIQUIS 5 MG TABS tablet TAKE ONE TABLET BY MOUTH TWICE DAILY  60 tablet 6  . hydrochlorothiazide (HYDRODIURIL) 25 MG tablet Take 25 mg by mouth daily.    Marland Kitchen JANUVIA 50 MG tablet Take 50 mg by mouth daily.     Marland Kitchen losartan (COZAAR) 50 MG tablet Take 50 mg by mouth daily.    . meloxicam (MOBIC) 15 MG tablet Take 15 mg by mouth daily.     . meloxicam (MOBIC) 15 MG tablet TAKE 1 TABLET BY MOUTH ONCE DAILY 30 tablet 0  . potassium chloride (K-DUR) 10 MEQ tablet Take 2 tablets (20 mEq total) by mouth daily. 14 tablet 0  . rosuvastatin (CRESTOR) 10 MG tablet Take 10 mg by mouth daily.     Current Facility-Administered Medications  Medication Dose Route Frequency Provider Last Rate  Last Dose  . betamethasone acetate-betamethasone sodium phosphate (CELESTONE) injection 3 mg  3 mg Intramuscular Once Edrick Kins, DPM         REVIEW OF SYSTEMS: On review of systems, the patient reports that she is doing well overall. She denies any chest pain, shortness of breath, cough, fevers, chills, night sweats, unintended weight changes. She denies any bowel or bladder disturbances, and denies abdominal pain, nausea or vomiting. She denies any new musculoskeletal or joint aches or pains. A complete review of systems is obtained and is otherwise negative.     PHYSICAL EXAM:  Wt Readings from Last 3 Encounters:  11/18/16 159 lb 9.6 oz (72.4 kg)  09/23/16 160 lb 9.6 oz (72.8 kg)  07/21/16 156 lb 9.6 oz (71 kg)   Temp Readings from Last 3 Encounters:  05/18/17 (!) 97.5 F (36.4 C) (Oral)  05/08/16 98.4 F (36.9 C) (Oral)  10/22/15 97.7 F (36.5 C) (Oral)   BP Readings from Last 3 Encounters:  05/18/17 (!) 141/53  11/18/16 (!) 143/75  09/23/16 126/78   Pulse Readings from Last 3 Encounters:  05/18/17 75  11/18/16 64  09/23/16 88     In general this is a well appearing caucasian female in no acute distress. She is alert and oriented x4 and appropriate throughout the examination. HEENT reveals that the patient is normocephalic, atraumatic. EOMs are intact. PERRLA. Skin is intact without any evidence of gross lesions. Cardiovascular exam reveals a regular rate and rhythm, no clicks rubs or murmurs are auscultated. Chest is clear to auscultation bilaterally. Lymphatic assessment is performed and does not reveal any adenopathy in the cervical, supraclavicular, axillary, or inguinal chains. Bilateral breast exam is performed and reveals ecchymosis and fullness deep to the biopsy site on the left breast. No palpable mass is noted of the right breast. Neither breast has any nipple bleeding or discharge. Abdomen has active bowel sounds in all quadrants and is intact. The abdomen is  soft, non tender, non distended. Lower extremities are negative for pretibial pitting edema, deep calf tenderness, cyanosis or clubbing.   ECOG = 0  0 - Asymptomatic (Fully active, able to carry on all predisease activities without restriction)  1 - Symptomatic but completely ambulatory (Restricted in physically strenuous activity but ambulatory and able to carry out work of a light or sedentary nature. For example, light housework, office work)  2 - Symptomatic, <50% in bed during the day (Ambulatory and capable of all self care but unable to carry out any work activities. Up and about more than 50% of waking hours)  3 - Symptomatic, >50% in bed, but not bedbound (Capable of only limited self-care, confined to bed or chair 50% or more of waking hours)  4 - Bedbound (Completely disabled. Cannot carry on any self-care. Totally confined to bed or chair)  5 - Death   Eustace Pen MM, Creech RH, Tormey DC, et al. [phone]). "Toxicity and response criteria of the [facility]". [place] Oncol. 5 (6): 649-55    LABORATORY DATA:  Lab Results  Component Value Date   WBC 7.4 05/18/2017   HGB 11.0 (L) 05/18/2017   HCT 34.5 (L) 05/18/2017   MCV 81.2 05/18/2017   PLT 202 05/18/2017   Lab Results  Component Value Date   NA 140 05/18/2017   K 3.1 (L) 05/18/2017   CL 100 (L) 05/18/2017   CO2 27 05/18/2017   Lab Results  Component Value Date   ALT 15 05/05/2016   AST 20 05/05/2016   ALKPHOS 61 05/05/2016   BILITOT 0.5 05/05/2016      RADIOGRAPHY: [place] Axilla Left  Result Date: 07/07/2017 CLINICAL DATA:  The 77 year old female with a recently diagnosed with cancer of the left breast presenting for ultrasound of the left axilla. EXAM: ULTRASOUND OF THE LEFT AXILLA COMPARISON:  Prior exams. FINDINGS: Ultrasound targeted to the left axilla demonstrates multiple normal-appearing lymph nodes. IMPRESSION: Normal left axillary ultrasound. RECOMMENDATION: Continue treatment plan  for known left breast carcinoma. I have discussed the findings and recommendations with the patient. Results were also provided in writing at the conclusion of the visit. If applicable, a reminder letter will be sent to the patient regarding the next appointment. BI-RADS CATEGORY  1: Negative. Electronically Signed   By: Ammie Ferrier M.D.   On: 07/07/2017 10:08   Mm Clip Placement Left  Result Date: 07/05/2017 CLINICAL DATA:  Post biopsy mammogram of the left breast for clip placement. EXAM: DIAGNOSTIC LEFT MAMMOGRAM POST STEREOTACTIC BIOPSY COMPARISON:  Previous exam(s). FINDINGS: Mammographic images were obtained following stereotactic guided biopsy of the left breast. The X shaped biopsy marking clip is positioned in the lower-inner quadrant of the left breast at the site of the biopsied asymmetry. IMPRESSION: The X shaped biopsy marking clip is placed in the lower-inner quadrant, at the site of the stereotactically biopsied asymmetry. Final Assessment: Post Procedure Mammograms for Marker Placement Electronically Signed   By: Ammie Ferrier M.D.   On: 07/05/2017 15:52   Mm Clip Placement Left  Result Date: 06/14/2017 CLINICAL DATA:  Status post stereotactic guided core biopsy of left breast mass. EXAM: DIAGNOSTIC LEFT MAMMOGRAM POST STEREOTACTIC BIOPSY COMPARISON:  Previous exam(s). FINDINGS: Mammographic images were obtained following stereotactic guided biopsy of mass in the lower inner quadrant of the left breast. A coil shaped clip is identified in the upper inner quadrant of the left breast following biopsy, likely representing a discrete area of asymmetry. An area of asymmetry persists unchanged in the lower portion of the left breast, best seen on the true lateral projection and warranting tissue diagnosis. IMPRESSION: 1. Coil shaped tissue marker clip within the upper inner quadrant following biopsy of intended target in the lower inner quadrant, compatible with a separate area of  asymmetry in the medial portion of the breast. 2. A 2nd area concern in the lower portion of the breast also warrants tissue diagnosis. Stereotactic guided core biopsy will be scheduled for the patient. Final Assessment: Post Procedure Mammograms for Marker Placement Electronically Signed   By: Nolon Nations M.D.   On: 06/14/2017 10:36   Mm Lt Breast Bx W Loc Dev 1st Lesion Image Bx Spec Stereo Guide  Addendum Date: 07/07/2017   ADDENDUM REPORT: 07/06/2017  14:43 ADDENDUM: Pathology revealed GRADE I INVASIVE MAMMARY CARCINOMA of the Left breast, lower inner quadrant. E-cadherin is positive supporting a ductal origin. This was found to be concordant by Dr. Ammie Ferrier. Pathology results were discussed with the patient by telephone. The patient reported doing well after the biopsy with tenderness at the site. Post biopsy instructions and care were reviewed and questions were answered. The patient was encouraged to call [facility] for any additional concerns. The patient was referred to [facility] on July 13, 2017. The patient is scheduled for a Left axillary ultrasound on July 07, 2017 at [facility]. Pathology results reported by Terie Purser, RN on 07/06/2017. Electronically Signed   By: Ammie Ferrier M.D.   On: 07/06/2017 14:43   Result Date: 07/07/2017 CLINICAL DATA:  77 year old female presenting for stereotactic biopsy of an asymmetry in the left breast. EXAM: LEFT BREAST STEREOTACTIC CORE NEEDLE BIOPSY COMPARISON:  Previous exams. FINDINGS: The patient and I discussed the procedure of stereotactic-guided biopsy including benefits and alternatives. We discussed the high likelihood of a successful procedure. We discussed the risks of the procedure including infection, bleeding, tissue injury, clip migration, and inadequate sampling. Informed written consent was given. The usual time  out protocol was performed immediately prior to the procedure. Using sterile technique and 1% Lidocaine as local anesthetic, under stereotactic guidance, a 9 gauge vacuum assisted device was used to perform core needle biopsy of inferior left breast using a lateral approach. Lesion quadrant: Lower-inner quadrant At the conclusion of the procedure, a X shaped tissue marker clip was deployed into the biopsy cavity. Follow-up 2-view mammogram was performed and dictated separately. IMPRESSION: Stereotactic-guided biopsy of an asymmetry in the lower-inner left breast. No apparent complications. Electronically Signed: By: Ammie Ferrier M.D. On: 07/04/2017 11:53   Mm Lt Breast Bx W Loc Dev 1st Lesion Image Bx Spec Stereo Guide  Addendum Date: 06/14/2017   ADDENDUM REPORT: 06/14/2017 15:13 ADDENDUM: Pathology revealed FIBROCYSTIC CHANGES of the Left breast, lower inner quadrant, (coil clip). This was found to be concordant by Dr. Nolon Nations. Pathology results were discussed with the patient by telephone. The patient reported doing well after the biopsy with tenderness at the site. Post biopsy instructions and care were reviewed and questions were answered. The patient was encouraged to call [facility] for any additional concerns. The patient is scheduled for stereotatic biopsy of a second site on July 04, 2017 at [place]. Pathology results reported by Terie Purser, RN on 06/14/2017. Electronically Signed   By: Nolon Nations M.D.   On: 06/14/2017 15:13   Result Date: 06/14/2017 CLINICAL DATA:  Patient presents for stereotactic guided core biopsy of mass in the lower inner quadrant of the left breast. EXAM: LEFT BREAST STEREOTACTIC CORE NEEDLE BIOPSY COMPARISON:  Previous exams. FINDINGS: The patient and I discussed the procedure of stereotactic-guided biopsy including benefits and alternatives. We discussed the high likelihood of a successful procedure. We discussed  the risks of the procedure including infection, bleeding, tissue injury, clip migration, and inadequate sampling. Informed written consent was given. The usual time out protocol was performed immediately prior to the procedure. Using sterile technique and 1% Lidocaine as local anesthetic, under stereotactic guidance, a 9 gauge vacuum assisted device was used to perform core needle biopsy of mass in the lower inner quadrant of the left breast using a superior to inferior approach.  Lesion quadrant: Lower inner left breast At the conclusion of the procedure, a coil shaped tissue marker clip was deployed into the biopsy cavity. Follow-up 2-view mammogram was performed and dictated separately. IMPRESSION: Stereotactic-guided biopsy of left breast mass. No apparent complications. Electronically Signed: By: Nolon Nations M.D. On: 06/13/2017 10:13       IMPRESSION/PLAN: 1. Probable Stage IA, [id number] grade 1, ER/PR positive invasive ductal carcinoma of the left breast. Dr. Lisbeth Renshaw discusses the pathology findings and reviews the nature of invasive breast disease. The consensus from the breast conference include breast conservation with lumpectomy with  sentinel mapping. Depending on the size of the final tumor measurements rendered by pathology, the tumor may be tested for oncotype dx score to determine a role for systemic therapy. Provided that chemotherapy is not indicated, the patient's course would then be followed by external radiotherapy to the breast followed by antiestrogen therapy. We discussed the risks, benefits, short, and long term effects of radiotherapy, and the patient is interested in proceeding. Dr. Lisbeth Renshaw discusses the delivery and logistics of radiotherapy and anticipates a course of 4 weeks. We will see her back about 2 weeks after surgery to move forward with the simulation and planning process and anticipate starting radiotherapy about 4 weeks after surgery.    The above documentation reflects  my direct findings during this shared patient visit. Please see the separate note by Dr. Lisbeth Renshaw on this date for the remainder of the patient's plan of care.    Carola Rhine, PAC

## 2017-07-13 NOTE — Progress Notes (Signed)
Nutrition Assessment  Reason for Assessment:  Pt seen in Breast Clinic  ASSESSMENT:   77 year old female with new diagnosis of breast cancer.  Past medical history reviewed.    Patient reports good appetite.  Medications:  reviewed  Labs: reviewed  Anthropometrics:   Height: 62 inches Weight: 159 lb 9.6 oz BMI: 29.3   NUTRITION DIAGNOSIS: Food and nutrition related knowledge deficit related to new diagnosis of breast cancer as evidenced by no prior need for nutrition related information.  INTERVENTION:   Discussed and provided packet of information regarding nutritional tips for breast cancer patients.  Questions answered.  Teachback method used.  Contact information provided and patient knows to contact me with questions/concerns.    MONITORING, EVALUATION, and GOAL: Pt will consume a healthy plant based diet to maintain lean body mass throughout treatment.   Lacora Folmer B. Zenia Resides, [place], [place] Registered Dietitian [phone] (pager)

## 2017-07-13 NOTE — Therapy (Signed)
[place], [place], [address] Phone: [phone]   Fax:  [phone]  Physical Therapy Evaluation  Patient Details  Name: Diana Cervantes MRN: [id number] Date of Birth: 1940/06/10 Referring Provider: Dr. Alphonsa Overall   Encounter Date: 07/13/2017  PT End of Session - 07/13/17 1035    Visit Number  1    Number of Visits  2    Date for PT Re-Evaluation  09/07/17    PT Start Time  1005    PT Stop Time  1117  (Pended)  Also saw pt from 3567 to 1050 for a total of 24 minutes    PT Time Calculation (min)  13 min    Activity Tolerance  Patient tolerated treatment well    Behavior During Therapy  [facility] for tasks assessed/performed       Past Medical History:  Diagnosis Date  . Atrial fibrillation ([place])   . Cataract   . Diabetes mellitus without complication ([place])   . Hypertension   . Stroke [facility])    paralyzed right side    Past Surgical History:  Procedure Laterality Date  . ABDOMINAL HYSTERECTOMY    . CHOLECYSTECTOMY    . IR GENERIC HISTORICAL  05/04/2016   IR ANGIO INTRA EXTRACRAN SEL COM CAROTID INNOMINATE UNI R MOD SED 05/04/2016 Luanne Bras, MD [facility]-INTER[facility]  . IR GENERIC HISTORICAL  05/04/2016   IR ANGIO VERTEBRAL SEL VERTEBRAL UNI L MOD SED 05/04/2016 Luanne Bras, MD [facility]  . IR GENERIC HISTORICAL  05/04/2016   IR PERCUTANEOUS ART THROMBECTOMY/INFUSION INTRACRANIAL INC DIAG ANGIO 05/04/2016 Luanne Bras, MD [facility]  . IR GENERIC HISTORICAL  06/22/2016   IR RADIOLOGIST EVAL & MGMT 06/22/2016 [facility]  . RADIOLOGY WITH ANESTHESIA N/A 05/04/2016   Procedure: RADIOLOGY WITH ANESTHESIA;  Surgeon: Luanne Bras, MD;  Location: [place];  Service: Radiology;  Laterality: N/A;    There were no vitals filed for this visit.   Subjective Assessment - 07/13/17 1023    Subjective  Patient presents alone today to the [facility] to meet with her medical team for her  newly diagnosed left breast cancer.    Pertinent History  Patient was diagnosed on 04/06/17 with left invasive ductal carcinoma breast cancer. It measures 8 mm and is locate din the lower inner quadrant. It is ER/PR positive and HER2 negative with a Ki67 of 1%. Her axillary ultrasound was negative. Her previous medical history includes a CVA in 12/17 resulting in right sided weakness which has mostly resolved. She continues to have right upper arm soreness.    Patient Stated Goals  Reduce lymphedema risk; learn post op shoulder ROM HEP    Currently in Pain?  Yes    Pain Score  1     Pain Location  Arm    Pain Orientation  Upper;Right    Pain Descriptors / Indicators  Sore    Pain Type  Chronic pain    Pain Onset  More than a month ago    Pain Frequency  Intermittent    Aggravating Factors   Nothing    Pain Relieving Factors  Nothing         [facility] PT Assessment - 07/13/17 0001      Assessment   Medical Diagnosis  Left breast cancer    Referring Provider  Dr. Alphonsa Overall    Onset Date/Surgical Date  04/06/17    Hand Dominance  Right    Prior Therapy  none      Precautions   Precautions  Other (comment)    Precaution Comments  active cancer      Restrictions   Weight Bearing Restrictions  No      Balance Screen   Has the patient fallen in the past 6 months  No    Has the patient had a decrease in activity level because of a fear of falling?   No    Is the patient reluctant to leave their home because of a fear of falling?   No      Home Film/video editor residence    Living Arrangements  Children Adult daughter    Available Help at Discharge  Family      Prior Function   Level of [place]  Retired    Leisure  She does not exercise      Cognition   Overall Cognitive Status  Within Functional Limits for tasks assessed      Posture/Postural Control   Posture/Postural Control  Postural limitations    Postural  Limitations  Rounded Shoulders;Forward head      ROM / Strength   AROM / PROM / Strength  AROM;Strength      AROM   AROM Assessment Site  Shoulder;Cervical    Right/Left Shoulder  Right;Left    Right Shoulder Extension  42 Degrees    Right Shoulder Flexion  130 Degrees    Right Shoulder ABduction  144 Degrees    Right Shoulder Internal Rotation  60 Degrees    Right Shoulder External Rotation  76 Degrees    Left Shoulder Extension  43 Degrees    Left Shoulder Flexion  136 Degrees    Left Shoulder ABduction  140 Degrees    Left Shoulder Internal Rotation  55 Degrees    Left Shoulder External Rotation  79 Degrees    Cervical Flexion  WNL    Cervical Extension  WNL    Cervical - Right Side Bend  WNL    Cervical - Left Side Bend  25% limited    Cervical - Right Rotation  25% limited    Cervical - Left Rotation  WNL        LYMPHEDEMA/ONCOLOGY QUESTIONNAIRE - 07/13/17 1034      Type   Cancer Type  Left breast cancer      Lymphedema Assessments   Lymphedema Assessments  Upper extremities      Right Upper Extremity Lymphedema   10 cm Proximal to Olecranon Process  30.2 cm    Olecranon Process  25.6 cm    10 cm Proximal to Ulnar Styloid Process  22.4 cm    Just Proximal to Ulnar Styloid Process  14.6 cm    Across Hand at [employer]  17 cm    At [place] of 2nd Digit  5.7 cm      Left Upper Extremity Lymphedema   10 cm Proximal to Olecranon Process  30.9 cm    Olecranon Process  25.1 cm    10 cm Proximal to Ulnar Styloid Process  21.1 cm    Just Proximal to Ulnar Styloid Process  14.3 cm    Across Hand at [employer]  16.9 cm    At [place] of 2nd Digit  5.8 cm          Objective measurements completed on examination: See above findings.     Patient was instructed today  in a home exercise program today for post op shoulder range of motion. These included active assist shoulder flexion in sitting, scapular retraction, wall walking with shoulder abduction, and hands  behind head external rotation.  She was encouraged to do these twice a day, holding 3 seconds and repeating 5 times when permitted by her physician.     PT Education - 07/13/17 1035    Education provided  Yes    Education Details  Lymphedema risk reduction and post op shoulder ROM HEP    Person(s) Educated  Patient    Methods  Explanation;Demonstration;Handout    Comprehension  Verbalized understanding;Returned demonstration          PT Long Term Goals - 07/13/17 1038      PT LONG TERM GOAL #1   Title  Patient will demonstrate she has regained left arm function and ROM back to baseline following breast cancer surgery.    Time  8    Period  Weeks    Status  New      Breast Clinic Goals - 07/13/17 1038      Patient will be able to verbalize understanding of pertinent lymphedema risk reduction practices relevant to her diagnosis specifically related to skin care.   Time  1    Period  Days    Status  Achieved      Patient will be able to return demonstrate and/or verbalize understanding of the post-op home exercise program related to regaining shoulder range of motion.   Time  1    Period  Days    Status  Achieved      Patient will be able to verbalize understanding of the importance of attending the postoperative After Breast Cancer Class for further lymphedema risk reduction education and therapeutic exercise.   Time  1    Period  Days    Status  Achieved            Plan - 07/13/17 1036    Clinical Impression Statement  Patient was diagnosed on 04/06/17 with left invasive ductal carcinoma breast cancer. It measures 8 mm and is locate din the lower inner quadrant. It is ER/PR positive and HER2 negative with a Ki67 of 1%. Her axillary ultrasound was negative. Her previous medical history includes a CVA in 12/17 resulting in right sided weakness which has mostly resolved. She continues to have right upper arm soreness. Her multidisciplinary medical team met prior to her  assessments to determine a recommended treatment plan. She is planning to have a left lumpectomy and sentinel node biopsy followed by Oncotype testing, radiation, and anti-estrogen therapy. She will benefit from post op PT to reassess and determine PT needs.    History and Personal Factors relevant to plan of care:  Previous CVA    Clinical Presentation  Stable    Clinical Decision Making  Low    Rehab Potential  Excellent    Clinical Impairments Affecting Rehab Potential  Previous CVA resulting in right arm weakness    PT Frequency  -- Eval and 1 f/u visit    PT Treatment/Interventions  ADLs/Self Care Home Management;Therapeutic exercise;Patient/family education    PT Next Visit Plan  Will reassess 3-4 weeks post op    PT Home Exercise Plan  Post op [place] ROM HEP    Consulted and Agree with Plan of Care  Patient       Patient will benefit from skilled therapeutic intervention in order to improve the following deficits and impairments:  Impaired UE functional use, Decreased knowledge of precautions, Postural dysfunction, Pain, Decreased range of motion  Visit Diagnosis: Malignant neoplasm of lower-inner quadrant of left breast in female, estrogen receptor positive ([place]) - Plan: PT plan of care cert/re-cert  Abnormal posture - Plan: PT plan of care cert/re-cert   Patient will follow up at outpatient cancer rehab 3-4 weeks following surgery.  If the patient requires physical therapy at that time, a specific plan will be dictated and sent to the referring physician for approval. The patient was educated today on appropriate basic range of motion exercises to begin post operatively and the importance of attending the After Breast Cancer class following surgery.  Patient was educated today on lymphedema risk reduction practices as it pertains to recommendations that will benefit the patient immediately following surgery.  She verbalized good understanding.     Problem List Patient Active  Problem List   Diagnosis Date Noted  . Malignant neoplasm of lower-inner quadrant of left breast in female, estrogen receptor positive ([place]) 07/12/2017  . Hyperlipemia 11/18/2016  . Chronic atrial fibrillation ([place]) 07/22/2016  . Chronic anticoagulation 07/22/2016  . Essential hypertension 07/22/2016  . Cerebrovascular accident (CVA) due to thrombosis of left middle cerebral artery ([place]) 05/04/2016  . Right sided weakness     Annia Friendly, [place] 07/13/17 11:40 AM  [place], [place], [address] Phone: [phone]   Fax:  [phone]  Name: Diana Cervantes MRN: [id number] Date of Birth: [birth year]

## 2017-07-15 ENCOUNTER — Ambulatory Visit: Payer: Self-pay | Admitting: Surgery

## 2017-07-15 DIAGNOSIS — Z170 Estrogen receptor positive status [ER+]: Principal | ICD-10-CM

## 2017-07-15 DIAGNOSIS — C50912 Malignant neoplasm of unspecified site of left female breast: Secondary | ICD-10-CM

## 2017-07-21 ENCOUNTER — Inpatient Hospital Stay: Payer: PPO

## 2017-07-21 ENCOUNTER — Encounter: Payer: Self-pay | Admitting: Genetic Counselor

## 2017-07-21 ENCOUNTER — Inpatient Hospital Stay (HOSPITAL_BASED_OUTPATIENT_CLINIC_OR_DEPARTMENT_OTHER): Payer: PPO | Admitting: Genetic Counselor

## 2017-07-21 ENCOUNTER — Telehealth: Payer: Self-pay | Admitting: *Deleted

## 2017-07-21 DIAGNOSIS — Z803 Family history of malignant neoplasm of breast: Secondary | ICD-10-CM | POA: Insufficient documentation

## 2017-07-21 DIAGNOSIS — Z800 Family history of malignant neoplasm of digestive organs: Secondary | ICD-10-CM | POA: Diagnosis not present

## 2017-07-21 DIAGNOSIS — Z1379 Encounter for other screening for genetic and chromosomal anomalies: Secondary | ICD-10-CM

## 2017-07-21 DIAGNOSIS — C50312 Malignant neoplasm of lower-inner quadrant of left female breast: Secondary | ICD-10-CM | POA: Diagnosis not present

## 2017-07-21 NOTE — Progress Notes (Signed)
[facility]      Initial Visit   Patient Name: Diana Cervantes Patient DOB: 1941/04/13 Patient Age: 77 y.o. Encounter Date: 07/21/2017  Referring Provider: Lurline Del, MD  Primary Care Provider: Velna Hatchet, MD  Reason for Visit: Evaluate for hereditary susceptibility to cancer    Assessment and Plan:  . Diana Cervantes's history is not suggestive of a hereditary predisposition to cancer. She meets NCCN criteria for genetic testing due to her own history of breast cancer in combination with a close relative who had breast cancer under age 9 (her mother).   . Testing is recommended to determine whether she has a pathogenic mutation that will impact her screening and risk-reduction for cancer. A negative result will be reassuring.  . Diana Cervantes wished to pursue genetic testing and a blood sample will be sent for analysis of the 83 genes on [facility]'s Multi-Cancer panel (ALK, APC, ATM, AXIN2, BAP1, BARD1, BLM, BMPR1A, BRCA1, BRCA2, BRIP1, CASR, CDC73, CDH1, CDK4, CDKN1B, CDKN1C, CDKN2A, CEBPA, CHEK2, CTNNA1, DICER1, DIS3L2, EGFR, EPCAM, FH, FLCN, GATA2, GPC3, GREM1, HOXB13, HRAS, KIT, MAX, MEN1, MET, MITF, MLH1, MSH2, MSH3, MSH6, MUTYH, NBN, NF1, NF2, NTHL1, PALB2, PDGFRA, PHOX2B, PMS2, POLD1, POLE, POT1, PRKAR1A, PTCH1, PTEN, RAD50, RAD51C, RAD51D, RB1, RECQL4, RET, RUNX1, SDHA, SDHAF2, SDHB, SDHC, SDHD, SMAD4, SMARCA4, SMARCB1, SMARCE1, STK11, SUFU, TERC, TERT, TMEM127, TP53, TSC1, TSC2, VHL, WRN, WT1).   . Results should be available in approximately 2-4 weeks, at which point we will contact her and address implications for her as well as address genetic testing for at-risk family members, if needed.     Dr. Jana Hakim was available for questions concerning this case. Total time spent by me in face-to-face counseling was approximately 30 minutes.   _____________________________________________________________________   History of Present Illness:  Diana Cervantes, a 77 y.o. female, is being seen at the [facility] due to a personal and family history of cancer. She presents to clinic today to discuss the possibility of a hereditary predisposition to cancer and discuss whether genetic testing is warranted.  Diana Cervantes was diagnosed with breast cancer at the age of 85.   Past Medical History:  Diagnosis Date  . Atrial fibrillation ([place])   . Cataract   . Diabetes mellitus without complication ([place])   . Family history of breast cancer   . Family history of rectal cancer   . Hypertension   . Stroke [facility])    paralyzed right side    Past Surgical History:  Procedure Laterality Date  . ABDOMINAL HYSTERECTOMY    . CHOLECYSTECTOMY    . IR GENERIC HISTORICAL  05/04/2016   IR ANGIO INTRA EXTRACRAN SEL COM CAROTID INNOMINATE UNI R MOD SED 05/04/2016 Luanne Bras, MD [facility]INTER[facility]  . IR GENERIC HISTORICAL  05/04/2016   IR ANGIO VERTEBRAL SEL VERTEBRAL UNI L MOD SED 05/04/2016 Luanne Bras, MD [facility]  . IR GENERIC HISTORICAL  05/04/2016   IR PERCUTANEOUS ART THROMBECTOMY/INFUSION INTRACRANIAL INC DIAG ANGIO 05/04/2016 Luanne Bras, MD [facility]INTER[facility]  . IR GENERIC HISTORICAL  06/22/2016   IR RADIOLOGIST EVAL & MGMT 06/22/2016 [facility]  . RADIOLOGY WITH ANESTHESIA N/A 05/04/2016   Procedure: RADIOLOGY WITH ANESTHESIA;  Surgeon: Luanne Bras, MD;  Location: [facility];  Service: Radiology;  Laterality: N/A;    Social History   Socioeconomic History  . Marital status: Divorced    Spouse name: Not on file  . Number of children: Not  on file  . Years of education: Not on file  . Highest education level: Not on file  Social Needs  . Financial resource strain: Not on file  . Food insecurity - worry: Not on file  . Food insecurity - inability: Not on file  . Transportation needs - medical: Not on file  . Transportation needs - non-medical: Not on file  Occupational History  . Not on file    Tobacco Use  . Smoking status: Former Smoker    Last attempt to quit: 10/21/2013    Years since quitting: 3.7  . Smokeless tobacco: Never Used  . Tobacco comment: Quit 2 years ago - uses [employer]ig[employer]  Substance and Sexual Activity  . Alcohol use: No    Alcohol/week: 0.0 oz  . Drug use: No  . Sexual activity: Not on file  Other Topics Concern  . Not on file  Social History Narrative  . Not on file     Family History:  During the visit, a 4-generation pedigree was obtained. Family tree will be scanned in the Media tab in Epic  Significant diagnoses include the following:  Family History  Problem Relation Age of Onset  . Stroke Mother   . Breast cancer Mother 40       deceased 95  . Stroke Father   . Rectal cancer Son 74       currently 60    Additionally, Diana Cervantes has a daughter (age 43) and 3 sons (ages 42, 45 and 68). She has no siblings. Her mother had 3 brothers and 2 sisters. Her father died at 81, cancer-free. He had multiple full and maternal half-siblings, but she did not know much information about them.  Diana Cervantes ancestry is Caucasian - NOS. There is no known Jewish ancestry and no consanguinity.  Discussion: We reviewed the characteristics, features and inheritance patterns of hereditary cancer syndromes. We discussed her risk of harboring a mutation in the context of her personal and family history. We discussed the process of genetic testing, insurance coverage and implications of results: positive, negative and variant of unknown significance (VUS).    Diana Cervantes questions were answered to her satisfaction today and she is welcome to call with any additional questions or concerns. Thank you for the referral and allowing [place] to share in the care of your patient.    Steele Berg, MS, [place] Certified Genetic Counselor phone: [phone] [email or web address]

## 2017-07-21 NOTE — Telephone Encounter (Signed)
  Oncology Nurse Navigator Documentation  Navigator Location: [facility] (07/21/17 1300)   )Navigator Encounter Type: Telephone;MDC Follow-up (07/21/17 1300) Telephone: Outgoing Call;Clinic/MDC Follow-up (07/21/17 1300)                                                  Time Spent with Patient: 15 (07/21/17 1300)

## 2017-07-26 ENCOUNTER — Other Ambulatory Visit: Payer: Self-pay | Admitting: Surgery

## 2017-07-26 DIAGNOSIS — Z170 Estrogen receptor positive status [ER+]: Principal | ICD-10-CM

## 2017-07-26 DIAGNOSIS — C50912 Malignant neoplasm of unspecified site of left female breast: Secondary | ICD-10-CM

## 2017-07-28 ENCOUNTER — Encounter: Payer: Self-pay | Admitting: Genetic Counselor

## 2017-07-28 ENCOUNTER — Ambulatory Visit: Payer: Self-pay | Admitting: Genetic Counselor

## 2017-07-28 DIAGNOSIS — Z1379 Encounter for other screening for genetic and chromosomal anomalies: Secondary | ICD-10-CM

## 2017-07-28 NOTE — Progress Notes (Signed)
Cancer Genetics Clinic       Genetic Test Results    Patient Name: Diana Cervantes Patient DOB: Dec 04, 1940 Patient Age: 77 y.o. Encounter Date: 07/28/2017  Referring Provider: Lurline Del, MD  Primary Care Provider: Velna Hatchet, MD   Diana Cervantes was called today to discuss genetic test results. Please see the Genetics note from her visit on 07/21/2017 for a detailed discussion of her personal and family history.  Genetic Testing: At the time of Diana Cervantes's visit, she decided to pursue genetic testing of multiple genes associated with hereditary susceptibility to cancer. Testing included sequencing and deletion/duplication analysis. Testing did not reveal any pathogenic mutation in any of the genes analyzed.  A copy of the genetic test report will be scanned into Epic under the Media tab.  The genes analyzed were the 83 genes on [facility]'s Multi-Cancer panel (ALK, APC, ATM, AXIN2, BAP1, BARD1, BLM, BMPR1A, BRCA1, BRCA2, BRIP1, CASR, CDC73, CDH1, CDK4, CDKN1B, CDKN1C, CDKN2A, CEBPA, CHEK2, CTNNA1, DICER1, DIS3L2, EGFR, EPCAM, FH, FLCN, GATA2, GPC3, GREM1, HOXB13, HRAS, KIT, MAX, MEN1, MET, MITF, MLH1, MSH2, MSH3, MSH6, MUTYH, NBN, NF1, NF2, NTHL1, PALB2, PDGFRA, PHOX2B, PMS2, POLD1, POLE, POT1, PRKAR1A, PTCH1, PTEN, RAD50, RAD51C, RAD51D, RB1, RECQL4, RET, RUNX1, SDHA, SDHAF2, SDHB, SDHC, SDHD, SMAD4, SMARCA4, SMARCB1, SMARCE1, STK11, SUFU, TERC, TERT, TMEM127, TP53, TSC1, TSC2, VHL, WRN, WT1).  Since the current test is not perfect, it is possible that there may be a gene mutation that current testing cannot detect, but that chance is small. It is possible that a different genetic factor, which has not yet been discovered or is not on this panel, is responsible for the cancer diagnoses in the family. Again, the likelihood of this is low. No additional testing is recommended at this time for Diana Cervantes.  A Variant of Uncertain Significance was detected:  SUFU c.926G>A (p.[id number]). This is still considered a normal result. While at this time, it is unknown if this finding is associated with increased cancer risk, the majority of these variants get reclassified to be inconsequential. Medical management should not be based on this finding. With time, we suspect the lab will determine the significance, if any. If we do learn more about it, we will try to contact Diana Cervantes to discuss it further. It is important to stay in touch with [place] periodically and keep the address and phone number up to date.  Cancer Screening: These results suggest that Diana Cervantes's cancer was most likely not due to an inherited predisposition. Most cancers happen by chance and this test, along with details of her family history, suggests that her cancer falls into this category. She is recommended to follow the cancer screening guidelines provided by her physician.   Family Members: Close family members are at some increased risk of developing cancer, over the general population risk, simply due to the family history. Women are recommended to have a yearly mammogram beginning at age 53, a yearly clinical breast exam, a yearly gynecologic exam and perform monthly breast self-exams. Colon cancer screening is recommended to begin earlier than age 47 in both men and women, because of the history of rectal cancer in Diana Cervantes's son.  Any relative who had cancer at a young age or had a particularly rare cancer may also wish to pursue genetic testing. Genetic counselors can be located in other cities, by visiting the website of the [facility] ([email or web address]) and searching for a Dietitian by zip code.   Family members are not recommended to get tested for the above VUS outside of a research protocol as this finding has no implications for their medical management.  Lastly, cancer genetics is a rapidly advancing field and it is possible that new  genetic tests will be appropriate for Diana Cervantes in the future. We encourage her to remain in contact with [place] on an annual basis so we can update her personal and family histories, and let her know of advances in cancer genetics that may benefit the family. Our contact number was provided. Diana Cervantes is welcome to call anytime with additional questions.     Steele Berg, MS, [place] Certified Genetic Counselor phone: [phone]

## 2017-08-01 ENCOUNTER — Encounter (HOSPITAL_BASED_OUTPATIENT_CLINIC_OR_DEPARTMENT_OTHER): Payer: Self-pay | Admitting: *Deleted

## 2017-08-01 ENCOUNTER — Other Ambulatory Visit: Payer: Self-pay

## 2017-08-01 ENCOUNTER — Encounter: Payer: Self-pay | Admitting: *Deleted

## 2017-08-01 NOTE — Progress Notes (Signed)
[place] Social [facility]  Clinical Social Work was referred by patient to review and complete healthcare advance directives.  Clinical Social Worker met with patient in [place] office.  The patient designated Boyd Kerbs as their primary healthcare agent.  Patient declined to completed healthcare living will.    Clinical Social Worker notarized documents and made copies for patient/family. Clinical Social Worker will send documents to medical records to be scanned into patient's chart. Clinical Social Worker encouraged patient/family to contact with any additional questions or concerns.  Diana Cervantes, MSW, LCSW, OSW-C Clinical Social Worker [facility] [phone]

## 2017-08-04 ENCOUNTER — Ambulatory Visit
Admission: RE | Admit: 2017-08-04 | Discharge: 2017-08-04 | Disposition: A | Discharge status: Home / Self Care | Payer: PPO | Source: Ambulatory Visit | Attending: Surgery | Admitting: Surgery

## 2017-08-04 DIAGNOSIS — R928 Other abnormal and inconclusive findings on diagnostic imaging of breast: Secondary | ICD-10-CM | POA: Diagnosis not present

## 2017-08-04 DIAGNOSIS — C50912 Malignant neoplasm of unspecified site of left female breast: Secondary | ICD-10-CM

## 2017-08-04 DIAGNOSIS — Z170 Estrogen receptor positive status [ER+]: Principal | ICD-10-CM

## 2017-08-04 NOTE — Progress Notes (Signed)
Ensure pre surgery drink given with instructions, surgical scrub given with instructions, pt verbalized understanding.

## 2017-08-04 NOTE — H&P (Signed)
Diana Cervantes  Location: [facility] Patient #: [id number] DOB: 04/03/1941 Undefined / Language: [place] / Race: White Female  History of Present Illness   The patient is a 77 year old female who presents with a complaint of breast cancer.  The PCP is Dr. Hoover Brunette  The patient was referred by Dr. Lennie Muckle  The pateint is at the [facility] - Oncology is Drs. Magrinat and Lisbeth Renshaw  She come by herself.  Her last mammogram was approximately 1 year ago. She had no prior breast biopsy. She is not on hormone replacement. 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  [facility] She had a an area in the LIQ of the left breast that on biopsy (06/13/2017) showed fibrocystic disease - this was considered concordant. Biopsy: 2nd area of left breast biospied on 07/04/2017 ([id number]) showed IDC, Grade 1/3, ER - 95%, PR - 95%, Ki67 -1%, Her2Neu - negative Family history of breast or ovarian cancer: Mother had breast cancer On hormone therapy: No  I discussed the options for breast cancer treatment with the patient. The patient is at the [facility], which includes medical oncology and radiation oncology. I discussed the surgical options of lumpectomy vs. mastectomy. If mastectomy, there is the possibility of reconstruction. I discussed the options of lymph node biopsy. The treatment plan depends on the pathologic staging of the tumor and the patient's personal wishes. The risks of surgery include, but are not limited to, bleeding, infection, the need for further surgery, and nerve injury. The patient has been given literature on the treatment of breast cancer.  Plan: 1) Cardiac clearance and timing of getting off Eliquis, 2) Left breast lumpectomy and left axillary SLNBx, 3) ??Oncotype, 4) Radiation tx, 5) anti-estrogen  Past Medical History: 1. DM since 2009, on pill  only 2. A fib Followed by Dr. Woody Seller (see just saw him a few weeks ago) 3. Anticoagulated On Eliquis To stop 5 days prior to surgery, unless cards feels otherwise 4. Quit smoking - 2004 5. Colonoscopy in 2009 6. Stroke in 05/04/2016, with dense right hemiplegia She has recovered entirely from the stroke 7. 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 HEENT: Normal. Pupils equal.  Neck: Supple. No mass. No thyroid mass. Lymph Nodes: No supraclavicular, cervical, or axillary nodes.  Lungs: Clear to auscultation and symmetric breath sounds. Heart: RRR. No murmur or rub. I think that she is in sinus rythm today.  Breasts: right: No mass or nodule  Left: Bruise in the LIQ. I feel a fullness, but I'm not sure that I feel a mass  Abdomen: Soft. No mass. No tenderness. No hernia. Normal bowel sounds. No abdominal scars. Rectal: Not done.  Extremities: Good strength and ROM in upper and lower extremities.  Neurologic: Grossly intact to motor and sensory function. Psychiatric: Has normal mood and affect. Behavior is normal.   Assessment & Plan  1.  MALIGNANT NEOPLASM OF LEFT BREAST, STAGE 1, ESTROGEN RECEPTOR POSITIVE (  N99.872)  Story: 2nd area of left breast biospied (LIQ) on 07/04/2017 ([id number]) showed IDC, Grade 1/3, ER - 95%, PR - 95%, Ki67 -1%, Her2Neu - negative  Oncology - Drs. Lindi Adie and Moody  Plan:   1. Cardiac clearance and timing of getting off Eliquis Clearance from  Dr. Veatrice Bourbon on 07/14/2017    2) Left breast lumpectomy and left axillary SLNBx,    3) ??Oncotype,   4) Radiation tx,   5) anti-estrogen    6)  Genetics Addendum Note(Shakeila Pfarr H. Lucia Gaskins MD; 07/28/2017 2:59 PM) Genetics negative.  2. DM since 2009, on pill only 3. A fib  Followed by Dr. Woody Seller (see just saw him a few weeks ago) 4. Anticoagulated  On Eliquis  To stop 5 days prior to surgery, unless cards feels otherwise 5. Quit smoking - 2004 6. Colonoscopy in 2009 7. Stroke in 05/04/2016, with dense right hemiplegia  She has recovered entirely from the stroke 8. Right shoulder pain, residual from the stroke She takes Moloixam for this   Alphonsa Overall, MD, [facility] Pager: [phone] Office phone:  [phone]

## 2017-08-05 ENCOUNTER — Ambulatory Visit (HOSPITAL_BASED_OUTPATIENT_CLINIC_OR_DEPARTMENT_OTHER): Payer: PPO | Admitting: Anesthesiology

## 2017-08-05 ENCOUNTER — Ambulatory Visit
Admission: RE | Admit: 2017-08-05 | Discharge: 2017-08-05 | Disposition: A | Discharge status: Home / Self Care | Payer: PPO | Source: Ambulatory Visit | Attending: Surgery | Admitting: Surgery

## 2017-08-05 ENCOUNTER — Encounter (HOSPITAL_BASED_OUTPATIENT_CLINIC_OR_DEPARTMENT_OTHER): Payer: Self-pay | Admitting: Certified Registered"

## 2017-08-05 ENCOUNTER — Ambulatory Visit (HOSPITAL_BASED_OUTPATIENT_CLINIC_OR_DEPARTMENT_OTHER)
Admission: RE | Admit: 2017-08-05 | Discharge: 2017-08-05 | Disposition: A | Discharge status: Home / Self Care | Payer: PPO | Source: Ambulatory Visit | Attending: Surgery | Admitting: Surgery

## 2017-08-05 ENCOUNTER — Encounter (HOSPITAL_COMMUNITY)
Admission: RE | Admit: 2017-08-05 | Discharge: 2017-08-05 | Disposition: A | Discharge status: Home / Self Care | Payer: PPO | Source: Ambulatory Visit | Attending: Surgery | Admitting: Surgery

## 2017-08-05 ENCOUNTER — Encounter (HOSPITAL_BASED_OUTPATIENT_CLINIC_OR_DEPARTMENT_OTHER)
Admission: RE | Disposition: A | Discharge status: Home / Self Care | Payer: Self-pay | Source: Ambulatory Visit | Attending: Surgery

## 2017-08-05 DIAGNOSIS — Z170 Estrogen receptor positive status [ER+]: Principal | ICD-10-CM

## 2017-08-05 DIAGNOSIS — I69351 Hemiplegia and hemiparesis following cerebral infarction affecting right dominant side: Secondary | ICD-10-CM | POA: Insufficient documentation

## 2017-08-05 DIAGNOSIS — M25511 Pain in right shoulder: Secondary | ICD-10-CM | POA: Insufficient documentation

## 2017-08-05 DIAGNOSIS — Z7901 Long term (current) use of anticoagulants: Secondary | ICD-10-CM | POA: Insufficient documentation

## 2017-08-05 DIAGNOSIS — Z87891 Personal history of nicotine dependence: Secondary | ICD-10-CM | POA: Diagnosis not present

## 2017-08-05 DIAGNOSIS — Z8041 Family history of malignant neoplasm of ovary: Secondary | ICD-10-CM | POA: Diagnosis not present

## 2017-08-05 DIAGNOSIS — C50912 Malignant neoplasm of unspecified site of left female breast: Secondary | ICD-10-CM | POA: Diagnosis not present

## 2017-08-05 DIAGNOSIS — I4891 Unspecified atrial fibrillation: Secondary | ICD-10-CM | POA: Diagnosis not present

## 2017-08-05 DIAGNOSIS — G8918 Other acute postprocedural pain: Secondary | ICD-10-CM | POA: Diagnosis not present

## 2017-08-05 DIAGNOSIS — Z803 Family history of malignant neoplasm of breast: Secondary | ICD-10-CM | POA: Insufficient documentation

## 2017-08-05 DIAGNOSIS — I10 Essential (primary) hypertension: Secondary | ICD-10-CM | POA: Insufficient documentation

## 2017-08-05 DIAGNOSIS — E119 Type 2 diabetes mellitus without complications: Secondary | ICD-10-CM | POA: Diagnosis not present

## 2017-08-05 DIAGNOSIS — I482 Chronic atrial fibrillation: Secondary | ICD-10-CM | POA: Diagnosis not present

## 2017-08-05 DIAGNOSIS — N6012 Diffuse cystic mastopathy of left breast: Secondary | ICD-10-CM | POA: Diagnosis not present

## 2017-08-05 DIAGNOSIS — C50312 Malignant neoplasm of lower-inner quadrant of left female breast: Secondary | ICD-10-CM | POA: Diagnosis not present

## 2017-08-05 HISTORY — PX: BREAST LUMPECTOMY WITH RADIOACTIVE SEED AND SENTINEL LYMPH NODE BIOPSY: SHX6550

## 2017-08-05 LAB — GLUCOSE, CAPILLARY: Glucose-Capillary: 114 mg/dL — ABNORMAL HIGH (ref 65–99)

## 2017-08-05 SURGERY — BREAST LUMPECTOMY WITH RADIOACTIVE SEED AND SENTINEL LYMPH NODE BIOPSY
Anesthesia: General | Site: Breast | Laterality: Left

## 2017-08-05 MED ORDER — EPHEDRINE SULFATE 50 MG/ML IJ SOLN
INTRAMUSCULAR | Status: DC | PRN
  Administered 2017-08-05 (×3): 10 mg via INTRAVENOUS

## 2017-08-05 MED ORDER — ACETAMINOPHEN 325 MG PO TABS: 325.0000 mg | ORAL_TABLET | ORAL | Status: DC | PRN

## 2017-08-05 MED ORDER — FENTANYL CITRATE (PF) 100 MCG/2ML IJ SOLN: 25.0000 ug | INTRAMUSCULAR | Status: DC | PRN

## 2017-08-05 MED ORDER — CEFAZOLIN SODIUM-DEXTROSE 2-4 GM/100ML-% IV SOLN
2.0000 g | INTRAVENOUS | Status: AC
  Administered 2017-08-05: 2 g via INTRAVENOUS

## 2017-08-05 MED ORDER — FENTANYL CITRATE (PF) 100 MCG/2ML IJ SOLN
INTRAMUSCULAR | Status: AC
  Filled 2017-08-05: qty 2

## 2017-08-05 MED ORDER — ONDANSETRON HCL 4 MG/2ML IJ SOLN: 4.0000 mg | Freq: Once | INTRAMUSCULAR | Status: DC | PRN

## 2017-08-05 MED ORDER — METHYLENE BLUE 0.5 % INJ SOLN
INTRAVENOUS | Status: AC
  Filled 2017-08-05: qty 10

## 2017-08-05 MED ORDER — MEPERIDINE HCL 25 MG/ML IJ SOLN: 6.2500 mg | INTRAMUSCULAR | Status: DC | PRN

## 2017-08-05 MED ORDER — CHLORHEXIDINE GLUCONATE CLOTH 2 % EX PADS: 6.0000 | MEDICATED_PAD | Freq: Once | CUTANEOUS | Status: DC

## 2017-08-05 MED ORDER — TECHNETIUM TC 99M SULFUR COLLOID FILTERED
1.0000 | Freq: Once | INTRAVENOUS | Status: AC | PRN
  Administered 2017-08-05: 1 via INTRADERMAL

## 2017-08-05 MED ORDER — SODIUM CHLORIDE 0.9 % IJ SOLN
INTRAMUSCULAR | Status: AC
  Filled 2017-08-05: qty 10

## 2017-08-05 MED ORDER — GABAPENTIN 300 MG PO CAPS
300.0000 mg | ORAL_CAPSULE | ORAL | Status: AC
  Administered 2017-08-05: 300 mg via ORAL

## 2017-08-05 MED ORDER — PROPOFOL 10 MG/ML IV BOLUS
INTRAVENOUS | Status: DC | PRN
  Administered 2017-08-05: 60 mg via INTRAVENOUS

## 2017-08-05 MED ORDER — LIDOCAINE HCL (CARDIAC) 20 MG/ML IV SOLN
INTRAVENOUS | Status: DC | PRN
  Administered 2017-08-05: 30 mg via INTRAVENOUS

## 2017-08-05 MED ORDER — ACETAMINOPHEN 160 MG/5ML PO SOLN: 325.0000 mg | ORAL | Status: DC | PRN

## 2017-08-05 MED ORDER — OXYCODONE HCL 5 MG/5ML PO SOLN: 5.0000 mg | Freq: Once | ORAL | Status: DC | PRN

## 2017-08-05 MED ORDER — MIDAZOLAM HCL 2 MG/2ML IJ SOLN
INTRAMUSCULAR | Status: AC
  Filled 2017-08-05: qty 2

## 2017-08-05 MED ORDER — KETOROLAC TROMETHAMINE 30 MG/ML IJ SOLN: 30.0000 mg | Freq: Once | INTRAMUSCULAR | Status: DC | PRN

## 2017-08-05 MED ORDER — MIDAZOLAM HCL 2 MG/2ML IJ SOLN: 1.0000 mg | INTRAMUSCULAR | Status: DC | PRN

## 2017-08-05 MED ORDER — PHENYLEPHRINE HCL 10 MG/ML IJ SOLN
INTRAVENOUS | Status: DC | PRN
  Administered 2017-08-05: 40 ug/min via INTRAVENOUS

## 2017-08-05 MED ORDER — CEFAZOLIN SODIUM-DEXTROSE 2-4 GM/100ML-% IV SOLN
INTRAVENOUS | Status: AC
  Filled 2017-08-05: qty 100

## 2017-08-05 MED ORDER — ONDANSETRON HCL 4 MG/2ML IJ SOLN
INTRAMUSCULAR | Status: DC | PRN
  Administered 2017-08-05: 4 mg via INTRAVENOUS

## 2017-08-05 MED ORDER — ACETAMINOPHEN 500 MG PO TABS
1000.0000 mg | ORAL_TABLET | ORAL | Status: AC
  Administered 2017-08-05: 1000 mg via ORAL

## 2017-08-05 MED ORDER — BUPIVACAINE-EPINEPHRINE (PF) 0.5% -1:200000 IJ SOLN
INTRAMUSCULAR | Status: DC | PRN
  Administered 2017-08-05: 20 mL

## 2017-08-05 MED ORDER — DEXAMETHASONE SODIUM PHOSPHATE 4 MG/ML IJ SOLN
INTRAMUSCULAR | Status: DC | PRN
  Administered 2017-08-05: 4 mg via INTRAVENOUS

## 2017-08-05 MED ORDER — FENTANYL CITRATE (PF) 100 MCG/2ML IJ SOLN
50.0000 ug | INTRAMUSCULAR | Status: DC | PRN
  Administered 2017-08-05: 25 ug via INTRAVENOUS

## 2017-08-05 MED ORDER — PHENYLEPHRINE HCL 10 MG/ML IJ SOLN
INTRAMUSCULAR | Status: DC | PRN
  Administered 2017-08-05: 40 ug via INTRAVENOUS

## 2017-08-05 MED ORDER — OXYCODONE HCL 5 MG PO TABS: 5.0000 mg | ORAL_TABLET | Freq: Once | ORAL | Status: DC | PRN

## 2017-08-05 MED ORDER — GABAPENTIN 300 MG PO CAPS
ORAL_CAPSULE | ORAL | Status: AC
  Filled 2017-08-05: qty 1

## 2017-08-05 MED ORDER — ACETAMINOPHEN 500 MG PO TABS
ORAL_TABLET | ORAL | Status: AC
  Filled 2017-08-05: qty 2

## 2017-08-05 MED ORDER — LACTATED RINGERS IV SOLN
INTRAVENOUS | Status: DC
  Administered 2017-08-05 (×2): via INTRAVENOUS

## 2017-08-05 MED ORDER — BUPIVACAINE HCL (PF) 0.25 % IJ SOLN
INTRAMUSCULAR | Status: AC
  Filled 2017-08-05: qty 30

## 2017-08-05 MED ORDER — SCOPOLAMINE 1 MG/3DAYS TD PT72: 1.0000 | MEDICATED_PATCH | Freq: Once | TRANSDERMAL | Status: DC | PRN

## 2017-08-05 MED ORDER — 0.9 % SODIUM CHLORIDE (POUR BTL) OPTIME
TOPICAL | Status: DC | PRN
  Administered 2017-08-05: 500 mL

## 2017-08-05 MED ORDER — HYDROCODONE-ACETAMINOPHEN 5-325 MG PO TABS: 1.0000 | ORAL_TABLET | Freq: Four times a day (QID) | ORAL | 0 refills | Status: AC | PRN

## 2017-08-05 SURGICAL SUPPLY — 57 items
ADH SKN CLS APL DERMABOND .7 (GAUZE/BANDAGES/DRESSINGS) ×1
APL SKNCLS STERI-STRIP NONHPOA (GAUZE/BANDAGES/DRESSINGS)
BENZOIN TINCTURE PRP APPL 2/3 (GAUZE/BANDAGES/DRESSINGS) IMPLANT
BINDER BREAST LRG (GAUZE/BANDAGES/DRESSINGS) ×2 IMPLANT
BINDER BREAST MEDIUM (GAUZE/BANDAGES/DRESSINGS) IMPLANT
BINDER BREAST XLRG (GAUZE/BANDAGES/DRESSINGS) IMPLANT
BINDER BREAST XXLRG (GAUZE/BANDAGES/DRESSINGS) IMPLANT
BLADE SURG 15 STRL LF DISP TIS (BLADE) ×1 IMPLANT
BLADE SURG 15 STRL SS (BLADE) ×6
CANISTER SUC SOCK COL 7IN (MISCELLANEOUS) IMPLANT
CANISTER SUCT 1200ML W/VALVE (MISCELLANEOUS) ×3 IMPLANT
CHLORAPREP W/TINT 26ML (MISCELLANEOUS) ×3 IMPLANT
CLIP VESOCCLUDE SM WIDE 6/CT (CLIP) ×3 IMPLANT
CLOSURE WOUND 1/2 X4 (GAUZE/BANDAGES/DRESSINGS)
COVER BACK TABLE 60X90IN (DRAPES) ×3 IMPLANT
COVER MAYO STAND STRL (DRAPES) ×3 IMPLANT
COVER PROBE W GEL 5X96 (DRAPES) ×3 IMPLANT
DECANTER SPIKE VIAL GLASS SM (MISCELLANEOUS) IMPLANT
DERMABOND ADVANCED (GAUZE/BANDAGES/DRESSINGS) ×2
DERMABOND ADVANCED .7 DNX12 (GAUZE/BANDAGES/DRESSINGS) ×1 IMPLANT
DEVICE DUBIN W/COMP PLATE 8390 (MISCELLANEOUS) ×3 IMPLANT
DRAPE LAPAROSCOPIC ABDOMINAL (DRAPES) ×3 IMPLANT
DRAPE UTILITY XL STRL (DRAPES) ×3 IMPLANT
DRSG PAD ABDOMINAL 8X10 ST (GAUZE/BANDAGES/DRESSINGS) IMPLANT
ELECT COATED BLADE 2.86 ST (ELECTRODE) ×3 IMPLANT
ELECT REM PT RETURN 9FT ADLT (ELECTROSURGICAL) ×3
ELECTRODE REM PT RTRN 9FT ADLT (ELECTROSURGICAL) ×1 IMPLANT
GAUZE SPONGE 4X4 12PLY STRL (GAUZE/BANDAGES/DRESSINGS) ×3 IMPLANT
GLOVE BIOGEL PI IND STRL 7.0 (GLOVE) IMPLANT
GLOVE BIOGEL PI INDICATOR 7.0 (GLOVE) ×4
GLOVE SURG SIGNA 7.5 PF LTX (GLOVE) ×2 IMPLANT
GLOVE SURG SS PI 6.5 STRL IVOR (GLOVE) ×2 IMPLANT
GLOVE SURG SS PI 7.5 STRL IVOR (GLOVE) ×6 IMPLANT
GOWN STRL REUS W/ TWL LRG LVL3 (GOWN DISPOSABLE) ×1 IMPLANT
GOWN STRL REUS W/ TWL XL LVL3 (GOWN DISPOSABLE) ×1 IMPLANT
GOWN STRL REUS W/TWL LRG LVL3 (GOWN DISPOSABLE) ×3
GOWN STRL REUS W/TWL XL LVL3 (GOWN DISPOSABLE) ×3
KIT MARKER MARGIN INK (KITS) ×3 IMPLANT
NDL HYPO 25X1 1.5 SAFETY (NEEDLE) ×1 IMPLANT
NDL SAFETY ECLIPSE 18X1.5 (NEEDLE) IMPLANT
NEEDLE HYPO 18GX1.5 SHARP (NEEDLE)
NEEDLE HYPO 25X1 1.5 SAFETY (NEEDLE) ×3 IMPLANT
NS IRRIG 1000ML POUR BTL (IV SOLUTION) ×3 IMPLANT
PACK BASIN DAY SURGERY FS (CUSTOM PROCEDURE TRAY) ×3 IMPLANT
PENCIL BUTTON HOLSTER BLD 10FT (ELECTRODE) ×3 IMPLANT
SHEET MEDIUM DRAPE 40X70 STRL (DRAPES) ×3 IMPLANT
SLEEVE SCD COMPRESS KNEE MED (MISCELLANEOUS) ×3 IMPLANT
SPONGE LAP 18X18 RF (DISPOSABLE) ×3 IMPLANT
STRIP CLOSURE SKIN 1/2X4 (GAUZE/BANDAGES/DRESSINGS) IMPLANT
SUT MNCRL AB 4-0 PS2 18 (SUTURE) ×3 IMPLANT
SUT VICRYL 3-0 CR8 SH (SUTURE) ×3 IMPLANT
SYR CONTROL 10ML LL (SYRINGE) ×3 IMPLANT
TOWEL OR 17X24 6PK STRL BLUE (TOWEL DISPOSABLE) ×3 IMPLANT
TOWEL OR NON WOVEN STRL DISP B (DISPOSABLE) ×3 IMPLANT
TUBE CONNECTING 20'X1/4 (TUBING) ×1
TUBE CONNECTING 20X1/4 (TUBING) ×2 IMPLANT
YANKAUER SUCT BULB TIP NO VENT (SUCTIONS) ×3 IMPLANT

## 2017-08-05 NOTE — Anesthesia Procedure Notes (Addendum)
Anesthesia Regional Block: Pectoralis block   Pre-Anesthetic Checklist: ,, timeout performed, Correct Patient, Correct Site, Correct Laterality, Correct Procedure, Correct Position, site marked, Risks and benefits discussed,  Surgical consent,  Pre-op evaluation,  At surgeon's request and post-op pain management  Laterality: Left  Prep: chloraprep       Needles:  Injection technique: Single-shot  Needle Type: Echogenic Stimulator Needle     Needle Length: 10cm  Needle Gauge: 21   Needle insertion depth: 3 cm   Additional Needles:   Procedures:,,,, ultrasound used (permanent image in chart),,,,  Narrative:  Start time: 08/05/2017 8:05 AM End time: 08/05/2017 8:15 AM Injection made incrementally with aspirations every 5 mL.  Performed by: Personally  Anesthesiologist: Lyn Hollingshead, MD

## 2017-08-05 NOTE — Interval H&P Note (Signed)
History and Physical Interval Note:  08/05/2017 8:36 AM  Diana Cervantes  has presented today for surgery, with the diagnosis of leftf breast cancer  The various methods of treatment have been discussed with the patient and family.   Her son, Rodman Key, is at the bedside.  She last took her Eliquis on Tuesday, 08/02/2017.  After consideration of risks, benefits and other options for treatment, the patient has consented to  Procedure(s): LEFT BREAST LUMPECTOMY WITH RADIOACTIVE SEED AND SENTINEL LYMPH NODE BIOPSY (Left) as a surgical intervention .  The patient's history has been reviewed, patient examined, no change in status, stable for surgery.  I have reviewed the patient's chart and labs.  Questions were answered to the patient's satisfaction.     Shann Medal

## 2017-08-05 NOTE — Discharge Instructions (Signed)
[facility] - DISCHARGE INSTRUCTIONS TO PATIENT  Activity:  Driving - May drive in 2 or 3 days.   Lifting - No lifting more than 15 pounds for 1 week  Wound Care:   Leave dressing on for 2 days, then remove bandage and shower  Diet:  As tolerated  Follow up appointment:  Call Dr. Pollie Friar office [facility]) at [phone] for an appointment in 2 to 3 weeks.  Medications and dosages:  Resume your home medications.  You have a prescription for:  Vicodin             You may restart you Eliquis on Sunday, 08/07/2017.  Call Dr. Lucia Gaskins or his office  [phone]) if you have:  Temperature greater than 100.4,  Persistent nausea and vomiting,  Severe uncontrolled pain,  Redness, tenderness, or signs of infection (pain, swelling, redness, odor or green/yellow discharge around the site),  Any other questions or concerns you may have after discharge.  In an emergency, call 911 or go to an Emergency Department at a nearby hospital.     Post Anesthesia Home Care Instructions  Activity: Get plenty of rest for the remainder of the day. A responsible individual must stay with you for 24 hours following the procedure.  For the next 24 hours, DO NOT: -Drive a car -Paediatric nurse -Drink alcoholic beverages -Take any medication unless instructed by your physician -Make any legal decisions or sign important papers.  Meals: Start with liquid foods such as gelatin or soup. Progress to regular foods as tolerated. Avoid greasy, spicy, heavy foods. If nausea and/or vomiting occur, drink only clear liquids until the nausea and/or vomiting subsides. Call your physician if vomiting continues.  Special Instructions/Symptoms: Your throat may feel dry or sore from the anesthesia or the breathing tube placed in your throat during surgery. If this causes discomfort, gargle with warm salt water. The discomfort should disappear within 24 hours.  If you had a scopolamine  patch placed behind your ear for the management of post- operative nausea and/or vomiting:  1. The medication in the patch is effective for 72 hours, after which it should be removed.  Wrap patch in a tissue and discard in the trash. Wash hands thoroughly with soap and water. 2. You may remove the patch earlier than 72 hours if you experience unpleasant side effects which may include dry mouth, dizziness or visual disturbances. 3. Avoid touching the patch. Wash your hands with soap and water after contact with the patch.

## 2017-08-05 NOTE — Transfer of Care (Signed)
Immediate Anesthesia Transfer of Care Note  Patient: Diana Cervantes  Procedure(s) Performed: LEFT BREAST LUMPECTOMY WITH RADIOACTIVE SEED AND SENTINEL LYMPH NODE BIOPSY (Left Breast)  Patient Location: PACU  Anesthesia Type:GA combined with regional for post-op pain  Level of Consciousness: awake and patient cooperative  Airway & Oxygen Therapy: Patient Spontanous Breathing and Patient connected to face mask oxygen  Post-op Assessment: Report given to RN and Post -op Vital signs reviewed and stable  Post vital signs: Reviewed and stable  Last Vitals: There were no vitals filed for this visit.  Last Pain: There were no vitals filed for this visit.       Complications: No apparent anesthesia complications

## 2017-08-05 NOTE — Anesthesia Preprocedure Evaluation (Addendum)
Anesthesia Evaluation  Patient identified by MRN, date of birth, ID band Patient awake    Reviewed: Allergy & Precautions, NPO status , Patient's Chart, lab work & pertinent test results  Airway Mallampati: I       Dental no notable dental hx. (+) Teeth Intact   Pulmonary former smoker,    Pulmonary exam normal breath sounds clear to auscultation       Cardiovascular hypertension, Pt. on medications Normal cardiovascular exam Rhythm:Regular Rate:Normal     Neuro/Psych negative psych ROS   GI/Hepatic negative GI ROS, Neg liver ROS,   Endo/Other  diabetes  Renal/GU negative Renal ROS  negative genitourinary   Musculoskeletal negative musculoskeletal ROS (+)   Abdominal Normal abdominal exam  (+)   Peds  Hematology negative hematology ROS (+)   Anesthesia Other Findings   Reproductive/Obstetrics                             Anesthesia Physical Anesthesia Plan  ASA: II  Anesthesia Plan: General   Post-op Pain Management:    Induction: Intravenous  PONV Risk Score and Plan: 3 and Ondansetron, Dexamethasone and Midazolam  Airway Management Planned: LMA  Additional Equipment:   Intra-op Plan:   Post-operative Plan: Extubation in OR  Informed Consent: I have reviewed the patients History and Physical, chart, labs and discussed the procedure including the risks, benefits and alternatives for the proposed anesthesia with the patient or authorized representative who has indicated his/her understanding and acceptance.   Dental advisory given  Plan Discussed with: CRNA and Surgeon  Anesthesia Plan Comments:         Anesthesia Quick Evaluation

## 2017-08-05 NOTE — Anesthesia Postprocedure Evaluation (Signed)
Anesthesia Post Note  Patient: Diana Cervantes  Procedure(s) Performed: LEFT BREAST LUMPECTOMY WITH RADIOACTIVE SEED AND SENTINEL LYMPH NODE BIOPSY (Left Breast)     Patient location during evaluation: PACU Anesthesia Type: General Level of consciousness: awake Pain management: pain level controlled Vital Signs Assessment: post-procedure vital signs reviewed and stable Respiratory status: spontaneous breathing Cardiovascular status: stable Postop Assessment: no apparent nausea or vomiting Anesthetic complications: no    Last Vitals:  Vitals:   08/05/17 1100 08/05/17 1121  BP: 122/73 (!) 125/59  Pulse: 75 91  Resp: 19 16  Temp:  36.7 C  SpO2: 94% 95%    Last Pain:  Vitals:   08/05/17 1045  PainSc: 0-No pain   Pain Goal:                 Iviana Blasingame JR,JOHN Nance Mccombs

## 2017-08-05 NOTE — Anesthesia Procedure Notes (Signed)
Procedure Name: LMA Insertion Date/Time: 08/05/2017 9:01 AM Performed by: Signe Colt, CRNA Pre-anesthesia Checklist: Patient identified, Emergency Drugs available, Suction available and Patient being monitored Patient Re-evaluated:Patient Re-evaluated prior to induction Oxygen Delivery Method: Circle system utilized Preoxygenation: Pre-oxygenation with 100% oxygen Induction Type: IV induction Ventilation: Mask ventilation without difficulty LMA: LMA inserted LMA Size: 4.0 Number of attempts: 1 Airway Equipment and Method: Bite block Placement Confirmation: positive ETCO2 Tube secured with: Tape Dental Injury: Teeth and Oropharynx as per pre-operative assessment

## 2017-08-05 NOTE — Op Note (Signed)
08/05/2017  10:17 AM  PATIENT:  Diana Cervantes DOB: July 05, 1940 MRN: [id number]  PREOP DIAGNOSIS:   Left breast cancer  POSTOP DIAGNOSIS:    Left breast cancer, 7 o'clock position (T1, N0)  PROCEDURE:   Procedure(s):  LEFT BREAST LUMPECTOMY WITH RADIOACTIVE SEED AND SENTINEL LYMPH NODE BIOPSY,  deep sentinel lymph node biopsy  SURGEON:   Alphonsa Overall, M.D.  ANESTHESIA:   general  Anesthesiologist: Lyn Hollingshead, MD CRNA: Blocker, Ernesta Amble, CRNA; Willa Frater, CRNA  General  EBL:  minimal  ml  DRAINS:  none   LOCAL MEDICATIONS USED:   Left pectoral block by anesthesia, 20 cc 1/4% marcaine  SPECIMEN:   Left breast lumpectomy (6 color paint), Inferior margin (suture anterior), left axillary sentinel lymph node (counts 600, background 20)  COUNTS CORRECT:  YES  INDICATIONS FOR PROCEDURE:  Diana Cervantes is a 77 y.o. (DOB: [birth year]) white female whose primary care physician is Velna Hatchet, MD and comes for left breast lumpectomy and left axillary sentinel lymph node biopsy.   She [facility] with D[facility]. Magrinat and [facility] for a invasive ductal carcinoma of the left breast.  The options for breast cancer treatment have been discussed with the patient. She elected to proceed with lumpectomy and axillary sentinel lymph node.    She was seen by Dr. Woody Seller for cardiac clearance.  Her last Eliquis was Tuesday, 3/5.    The indications and potential complications of surgery were explained to the patient. Potential complications include, but are not limited to, bleeding, infection, the need for further surgery, and nerve injury.     She had a I131 seed placed on 08/03/2017 in her left breast at [facility].  The seed is in the 7 o'clock position of the left breast.   In the holding area, her left areola was injected with 1 millicurie of Technitium Sulfur Colloid.  OPERATIVE NOTE:   The patient was taken to operating room # 5 at [facility] where she underwent a general anesthesia  supervised by Anesthesiologist: Lyn Hollingshead, MD CRNA: Blocker, Ernesta Amble, CRNA; Willa Frater, CRNA. Her left breast and axilla were prepped with  ChloraPrep and sterilely draped.    A time-out and the surgical check list was reviewed.    I turned attention to the cancer which was about at the 7 o'clock position of the left breast.   I used the Neoprobe to identify the I131 seed.  I tried to excise an area around the tumor of at least 1 cm.    I excised this block of breast tissue approximately 3 cm by 4 cm  in diameter.   I painted the lumpectomy specimen with the 6 color paint kit and did a specimen mammogram which confirmed the mass, clip, and the seed were all in the right position in the specimen.  The specimen was sent to pathology who called back to confirm that they have the seed and the specimen.   I thought the tumor was close to the inferior margin, so I excised an additional inferior margin.  This was painted and a suture marked the anterior margin.   I then started the left deep axillary sentinel lymph node biopsy. I made an incision in the left axilla.  I found a hot area at the junction of the breast and the pectoralis major muscle, deep in the axilla. I cut down and  identified a hot node that had counts  of 600 and the background has 20 counts.  I checked her internal mammary nodes and supraclavicular nodes with the neoprobe and found no other hot area. The axillary node was then sent to pathology.    I then irrigated the wound with saline. I infiltrated approximately 20 mL of 1/2% Marcaine between the incisions. She had had a left pectoral block.  I placed 4 clips to mark biopsy cavity, at 12, 3, 6, and 9 o'clock.  I then closed all the wounds in layers using 3-0 Vicryl sutures for the deep layer. At the skin, I closed the incisions with a 4-0 Monocryl suture. The incisions were then painted with Dermabond.  She had gauze place  over the wounds and placed in a breast binder.   The patient tolerated the procedure well, was transported to the recovery room in good condition. Sponge and needle count were correct at the end of the case.   Final pathology is pending.   Alphonsa Overall, MD, [facility] Pager: [phone] Office phone:  [phone]

## 2017-08-08 ENCOUNTER — Encounter (HOSPITAL_BASED_OUTPATIENT_CLINIC_OR_DEPARTMENT_OTHER): Payer: Self-pay | Admitting: Surgery

## 2017-08-08 DIAGNOSIS — I119 Hypertensive heart disease without heart failure: Secondary | ICD-10-CM | POA: Diagnosis not present

## 2017-08-08 DIAGNOSIS — I480 Paroxysmal atrial fibrillation: Secondary | ICD-10-CM | POA: Diagnosis not present

## 2017-08-08 DIAGNOSIS — E785 Hyperlipidemia, unspecified: Secondary | ICD-10-CM | POA: Diagnosis not present

## 2017-08-08 LAB — POCT I-STAT, CHEM 8
BUN: 12 mg/dL (ref 6–20)
CHLORIDE: 100 mmol/L — AB (ref 101–111)
CREATININE: 0.6 mg/dL (ref 0.44–1.00)
Calcium, Ion: 0.99 mmol/L — ABNORMAL LOW (ref 1.15–1.40)
GLUCOSE: 101 mg/dL — AB (ref 65–99)
HEMATOCRIT: 36 % (ref 36.0–46.0)
HEMOGLOBIN: 12.2 g/dL (ref 12.0–15.0)
POTASSIUM: 3.3 mmol/L — AB (ref 3.5–5.1)
Sodium: 142 mmol/L (ref 135–145)
TCO2: 25 mmol/L (ref 22–32)

## 2017-08-10 ENCOUNTER — Other Ambulatory Visit: Payer: Self-pay | Admitting: *Deleted

## 2017-08-10 DIAGNOSIS — C50312 Malignant neoplasm of lower-inner quadrant of left female breast: Secondary | ICD-10-CM

## 2017-08-10 DIAGNOSIS — Z170 Estrogen receptor positive status [ER+]: Principal | ICD-10-CM

## 2017-08-11 ENCOUNTER — Encounter: Payer: Self-pay | Admitting: Radiation Oncology

## 2017-08-22 ENCOUNTER — Telehealth: Payer: Self-pay | Admitting: Oncology

## 2017-08-22 NOTE — Telephone Encounter (Signed)
[facility] - moved 4/24 lab/fu to 4/17. Spoke with patient she is aware.

## 2017-08-29 ENCOUNTER — Encounter: Payer: Self-pay | Admitting: Physical Therapy

## 2017-08-29 ENCOUNTER — Ambulatory Visit: Payer: PPO | Attending: Surgery | Admitting: Physical Therapy

## 2017-08-29 ENCOUNTER — Other Ambulatory Visit: Payer: Self-pay

## 2017-08-29 DIAGNOSIS — Z483 Aftercare following surgery for neoplasm: Secondary | ICD-10-CM | POA: Diagnosis not present

## 2017-08-29 DIAGNOSIS — C50312 Malignant neoplasm of lower-inner quadrant of left female breast: Secondary | ICD-10-CM | POA: Diagnosis not present

## 2017-08-29 DIAGNOSIS — Z170 Estrogen receptor positive status [ER+]: Secondary | ICD-10-CM

## 2017-08-29 DIAGNOSIS — R293 Abnormal posture: Secondary | ICD-10-CM

## 2017-08-29 NOTE — Therapy (Signed)
[place], [place], [address] Phone: [phone]   Fax:  [phone]  Physical Therapy Treatment  Patient Details  Name: Diana Cervantes MRN: [id number] Date of Birth: [birth year] Referring Provider: Dr. Alphonsa Overall   Encounter Date: 08/29/2017  PT End of Session - 08/29/17 1342    Visit Number  2    Number of Visits  2    PT Start Time  1302    PT Stop Time  1342    PT Time Calculation (min)  40 min    Activity Tolerance  Patient tolerated treatment well    Behavior During Therapy  [facility] for tasks assessed/performed       Past Medical History:  Diagnosis Date  . Atrial fibrillation ([place])   . Cataract   . Diabetes mellitus without complication ([place])   . Family history of breast cancer   . Family history of rectal cancer   . Genetic testing 07/21/17 & 07/28/17   Multi-Cancer panel (83 genes) @ [facility] - No pathogenic mutations detected  . Hypertension   . Stroke [facility])    paralyzed right side    Past Surgical History:  Procedure Laterality Date  . ABDOMINAL HYSTERECTOMY    . BREAST LUMPECTOMY WITH RADIOACTIVE SEED AND SENTINEL LYMPH NODE BIOPSY Left 08/05/2017   Procedure: LEFT BREAST LUMPECTOMY WITH RADIOACTIVE SEED AND SENTINEL LYMPH NODE BIOPSY;  Surgeon: Alphonsa Overall, MD;  Location: [facility];  Service: General;  Laterality: Left;  . CHOLECYSTECTOMY    . IR GENERIC HISTORICAL  05/04/2016   IR ANGIO INTRA EXTRACRAN SEL COM CAROTID INNOMINATE UNI R MOD SED 05/04/2016 Luanne Bras, MD [facility]INTER[facility]  . IR GENERIC HISTORICAL  05/04/2016   IR ANGIO VERTEBRAL SEL VERTEBRAL UNI L MOD SED 05/04/2016 Luanne Bras, MD [facility]  . IR GENERIC HISTORICAL  05/04/2016   IR PERCUTANEOUS ART THROMBECTOMY/INFUSION INTRACRANIAL INC DIAG ANGIO 05/04/2016 Luanne Bras, MD [facility]  . IR GENERIC HISTORICAL  06/22/2016   IR RADIOLOGIST EVAL & MGMT 06/22/2016 [facility]  .  RADIOLOGY WITH ANESTHESIA N/A 05/04/2016   Procedure: RADIOLOGY WITH ANESTHESIA;  Surgeon: Luanne Bras, MD;  Location: [facility];  Service: Radiology;  Laterality: N/A;    There were no vitals filed for this visit.  Subjective Assessment - 08/29/17 1311    Subjective  Patient underwent a left lumpectomy and sentinel node biopsy on 08/05/17 (0/2 nodes). She does not need chemo and is unsure if she will need radiation.    Pertinent History  Patient was diagnosed on 04/06/17 with left invasive ductal carcinoma breast cancer.Patient underwent a left lumpectomy and sentinel node biopsy on 08/05/17 (0/2 nodes). It is ER/PR positive and HER2 negative with a Ki67 of 1%. Her previous medical history includes a CVA in 12/17 resulting in right sided weakness which has mostly resolved. She continues to have right upper arm soreness.    Patient Stated Goals  See if my arm is ok    Currently in Pain?  No/denies         [facility] PT Assessment - 08/29/17 0001      Assessment   Medical Diagnosis  s/p left lumpectomy and SLNB    Referring Provider  Dr. Alphonsa Overall    Onset Date/Surgical Date  08/05/17    Hand Dominance  Right    Prior Therapy  Baseline assessments      Precautions   Precautions  Other (comment)    Precaution Comments  recent cancer surgery      Restrictions   Weight Bearing Restrictions  No      Balance Screen   Has the patient fallen in the past 6 months  No    Has the patient had a decrease in activity level because of a fear of falling?   No    Is the patient reluctant to leave their home because of a fear of falling?   No      Home Environment   Living Environment  Private residence    [place] Adult son    Available Help at Discharge  Family      Prior Function   Level of [place]  Retired    Leisure  She is walking 30 minutes every day      Cognition   Overall Cognitive Status  Within Functional Limits for tasks assessed       Observation/Other Assessments   Observations  Incision sites appear to be healing well. The axillary and breast incisions both have glue still present and are not fully healed but no redness or signs of infection are present.      Posture/Postural Control   Posture/Postural Control  Postural limitations    Postural Limitations  Rounded Shoulders;Forward head      ROM / Strength   AROM / PROM / Strength  AROM      AROM   AROM Assessment Site  Shoulder    Left Shoulder Extension  50 Degrees    Left Shoulder Flexion  119 Degrees    Left Shoulder ABduction  140 Degrees    Left Shoulder Internal Rotation  60 Degrees    Left Shoulder External Rotation  76 Degrees        LYMPHEDEMA/ONCOLOGY QUESTIONNAIRE - 08/29/17 1317      Type   Cancer Type  Left breast      Surgeries   Lumpectomy Date  08/05/17    Sentinel Lymph Node Biopsy Date  08/05/17    Number Lymph Nodes Removed  2      Treatment   Active Chemotherapy Treatment  No    Past Chemotherapy Treatment  No    Active Radiation Treatment  No    Past Radiation Treatment  No    Current Hormone Treatment  No    Past Hormone Therapy  No      What other symptoms do you have   Are you Having Heaviness or Tightness  No    Are you having Pain  No    Are you having pitting edema  No    Is it Hard or Difficult finding clothes that fit  No    Do you have infections  No    Is there Decreased scar mobility  Yes    [place] Sign  No      Lymphedema Assessments   Lymphedema Assessments  Upper extremities      Right Upper Extremity Lymphedema   10 cm Proximal to Olecranon Process  30.8 cm    Olecranon Process  24.9 cm    10 cm Proximal to Ulnar Styloid Process  21.7 cm    Just Proximal to Ulnar Styloid Process  14.6 cm    Across Hand at [employer]  17 cm    At [place] of 2nd Digit  5.5 cm      Left Upper Extremity Lymphedema   10 cm Proximal to Olecranon Process  30.7  cm    Olecranon Process  25.4 cm    10 cm Proximal  to Ulnar Styloid Process  20.6 cm    Just Proximal to Ulnar Styloid Process  14.3 cm    Across Hand at [employer]  16.6 cm    At [place] of 2nd Digit  5.5 cm        Quick Dash - 08/29/17 0001    Open a tight or new jar  No difficulty    Do heavy household chores (wash walls, wash floors)  No difficulty    Carry a shopping bag or briefcase  Mild difficulty    Wash your back  No difficulty    Use a knife to cut food  No difficulty    Recreational activities in which you take some force or impact through your arm, shoulder, or hand (golf, hammering, tennis)  No difficulty    During the past week, to what extent has your arm, shoulder or hand problem interfered with your normal social activities with family, friends, neighbors, or groups?  Slightly    During the past week, to what extent has your arm, shoulder or hand problem limited your work or other regular daily activities  Slightly    Arm, shoulder, or hand pain.  None    Tingling (pins and needles) in your arm, shoulder, or hand  None    Difficulty Sleeping  No difficulty    DASH Score  6.82 %                     PT Education - 08/29/17 1338    Education provided  Yes    Education Details  Lymphedema risk reduction using ABC packet    Person(s) Educated  Patient;Child(ren)    Methods  Explanation;Demonstration;Handout    Comprehension  Verbalized understanding;Returned demonstration          PT Long Term Goals - 08/29/17 1345      PT LONG TERM GOAL #1   Title  Patient will demonstrate she has regained left arm function and ROM back to baseline following breast cancer surgery.    Time  8    Period  Weeks    Status  Achieved      Breast Clinic Goals - 07/13/17 1038      Patient will be able to verbalize understanding of pertinent lymphedema risk reduction practices relevant to her diagnosis specifically related to skin care.   Time  1    Period  Days    Status  Achieved      Patient will be able to  return demonstrate and/or verbalize understanding of the post-op home exercise program related to regaining shoulder range of motion.   Time  1    Period  Days    Status  Achieved      Patient will be able to verbalize understanding of the importance of attending the postoperative After Breast Cancer Class for further lymphedema risk reduction education and therapeutic exercise.   Time  1    Period  Days    Status  Achieved           Plan - 08/29/17 1343    Clinical Impression Statement  Patient is doing very well s.p left lumpectomy and sentinel node biopsy. She had 2 negative nodes removed and there was no residual cancer left in her lumpectomy. Her shoulder ROM is almost back to baseline (lacking a bit of flexion) and there are no  signs of lymphedema. She was educated today on risk reduction practices and seemed to have good understanding of that. Her incisions sites are still healing but appear to be on track without sign of infection.    PT Treatment/Interventions  ADLs/Self Care Home Management;Therapeutic exercise;Patient/family education    PT Next Visit Plan  D/C    PT Home Exercise Plan  Post op shoulder ROM HEP    Consulted and Agree with Plan of Care  Patient;Family member/caregiver    Family Member Consulted  Son       Patient will benefit from skilled therapeutic intervention in order to improve the following deficits and impairments:     Visit Diagnosis: Malignant neoplasm of lower-inner quadrant of left breast in female, estrogen receptor positive ([place])  Abnormal posture  Aftercare following surgery for neoplasm     Problem List Patient Active Problem List   Diagnosis Date Noted  . Genetic testing   . Family history of breast cancer   . Family history of rectal cancer   . Malignant neoplasm of lower-inner quadrant of left breast in female, estrogen receptor positive ([place]) 07/12/2017  . Hyperlipemia 11/18/2016  . Chronic atrial fibrillation ([place])  07/22/2016  . Chronic anticoagulation 07/22/2016  . Essential hypertension 07/22/2016  . Cerebrovascular accident (CVA) due to thrombosis of left middle cerebral artery ([place]) 05/04/2016  . Right sided weakness    PHYSICAL THERAPY DISCHARGE SUMMARY  Visits from Start of Care: 2  Current functional level related to goals / functional outcomes: Goals met. Slightly limited with shoulder flexion but was educated on how to gain last few degrees and get back to baseline with that.   Remaining deficits: None. She has resumed all normal activities   Education / Equipment: Lymphedema risk reduction practices Plan: Patient agrees to discharge.  Patient goals were met. Patient is being discharged due to meeting the stated rehab goals.  ?????    Annia Friendly, [place] 08/29/17 1:47 PM  [place], [place], [address] Phone: [phone]   Fax:  [phone]  Name: Diana Cervantes MRN: [id number] Date of Birth: 04/25/1941

## 2017-09-01 ENCOUNTER — Encounter: Payer: Self-pay | Admitting: Radiation Oncology

## 2017-09-01 ENCOUNTER — Other Ambulatory Visit: Payer: Self-pay

## 2017-09-01 ENCOUNTER — Ambulatory Visit
Admission: RE | Admit: 2017-09-01 | Discharge: 2017-09-01 | Disposition: A | Discharge status: Home / Self Care | Payer: PPO | Source: Ambulatory Visit | Attending: Radiation Oncology | Admitting: Radiation Oncology

## 2017-09-01 VITALS — BP 149/76 | HR 70 | Temp 98.0°F | Resp 18 | Ht 62.0 in | Wt 154.4 lb

## 2017-09-01 DIAGNOSIS — Z808 Family history of malignant neoplasm of other organs or systems: Secondary | ICD-10-CM | POA: Insufficient documentation

## 2017-09-01 DIAGNOSIS — E119 Type 2 diabetes mellitus without complications: Secondary | ICD-10-CM | POA: Insufficient documentation

## 2017-09-01 DIAGNOSIS — Z90710 Acquired absence of both cervix and uterus: Secondary | ICD-10-CM | POA: Diagnosis not present

## 2017-09-01 DIAGNOSIS — Z803 Family history of malignant neoplasm of breast: Secondary | ICD-10-CM | POA: Insufficient documentation

## 2017-09-01 DIAGNOSIS — Z9889 Other specified postprocedural states: Secondary | ICD-10-CM | POA: Diagnosis not present

## 2017-09-01 DIAGNOSIS — Z8673 Personal history of transient ischemic attack (TIA), and cerebral infarction without residual deficits: Secondary | ICD-10-CM | POA: Insufficient documentation

## 2017-09-01 DIAGNOSIS — C50312 Malignant neoplasm of lower-inner quadrant of left female breast: Secondary | ICD-10-CM

## 2017-09-01 DIAGNOSIS — Z170 Estrogen receptor positive status [ER+]: Principal | ICD-10-CM

## 2017-09-01 DIAGNOSIS — Z79899 Other long term (current) drug therapy: Secondary | ICD-10-CM | POA: Diagnosis not present

## 2017-09-01 DIAGNOSIS — Z87891 Personal history of nicotine dependence: Secondary | ICD-10-CM | POA: Insufficient documentation

## 2017-09-01 DIAGNOSIS — Z9049 Acquired absence of other specified parts of digestive tract: Secondary | ICD-10-CM | POA: Insufficient documentation

## 2017-09-01 DIAGNOSIS — Z823 Family history of stroke: Secondary | ICD-10-CM | POA: Diagnosis not present

## 2017-09-01 NOTE — Progress Notes (Signed)
Radiation Oncology         [phone] ________________________________  Name: Diana Cervantes        MRN: [id number]  Date of Service: 09/01/2017 DOB: May 11, 1941  CC:Diana Hatchet, MD  Magrinat, Diana Dad, MD     REFERRING PHYSICIAN: Magrinat, Diana Dad, MD   DIAGNOSIS: The encounter diagnosis was Malignant neoplasm of lower-inner quadrant of left breast in female, estrogen receptor positive ([place]).   HISTORY OF PRESENT ILLNESS: Diana Cervantes is a 77 y.o. female seen in the multidisciplinary breast clinic for a new diagnosis of left breast cancer. The patient was noted to have a screening asymmetry in the left breast and diagnostic imaging revealed an 8 mm mass in the lower inner quadrant of the breast. A second mass in the lower portion of the left breast which was approximately 5 mm was also noted. There was no ultrasound correlate, and her axilla was negative for adenopathy. She had biopsies of each site, and the 8 mm lesion was negative for malignancy. Her biopsy of the other lesion that was approximately 5 mm, revealed a grade 1, invasive ductal carcinoma, ER/PR positive, HER2 negative, with a Ki 67 of 1%. She underwent left breast lumpectomy with sentinel node biopsy on 08/05/2017, her surgical specimen did not reveal any residual cancer, and her 3 sampled nodes were negative for disease.  She comes today for follow-up, she is scheduled to see Dr. Jana Hakim in about 2 weeks.  PREVIOUS RADIATION THERAPY: No   PAST MEDICAL HISTORY:  Past Medical History:  Diagnosis Date  . Atrial fibrillation ([place])   . Cataract   . Diabetes mellitus without complication ([place])   . Family history of breast cancer   . Family history of rectal cancer   . Genetic testing 07/21/17 & 07/28/17   Multi-Cancer panel (83 genes) @ [facility] - No pathogenic mutations detected  . Hypertension   . Stroke ([place])    paralyzed right side       PAST SURGICAL HISTORY: Past Surgical History:  Procedure  Laterality Date  . ABDOMINAL HYSTERECTOMY    . BREAST LUMPECTOMY WITH RADIOACTIVE SEED AND SENTINEL LYMPH NODE BIOPSY Left 08/05/2017   Procedure: LEFT BREAST LUMPECTOMY WITH RADIOACTIVE SEED AND SENTINEL LYMPH NODE BIOPSY;  Surgeon: Alphonsa Overall, MD;  Location: [facility];  Service: General;  Laterality: Left;  . CHOLECYSTECTOMY    . IR GENERIC HISTORICAL  05/04/2016   IR ANGIO INTRA EXTRACRAN SEL COM CAROTID INNOMINATE UNI R MOD SED 05/04/2016 Luanne Bras, MD [facility]  . IR GENERIC HISTORICAL  05/04/2016   IR ANGIO VERTEBRAL SEL VERTEBRAL UNI L MOD SED 05/04/2016 Luanne Bras, MD [facility]  . IR GENERIC HISTORICAL  05/04/2016   IR PERCUTANEOUS ART THROMBECTOMY/INFUSION INTRACRANIAL INC DIAG ANGIO 05/04/2016 Luanne Bras, MD [facility]  . IR GENERIC HISTORICAL  06/22/2016   IR RADIOLOGIST EVAL & MGMT 06/22/2016 [facility]  . RADIOLOGY WITH ANESTHESIA N/A 05/04/2016   Procedure: RADIOLOGY WITH ANESTHESIA;  Surgeon: Luanne Bras, MD;  Location: [facility];  Service: Radiology;  Laterality: N/A;     FAMILY HISTORY:  Family History  Problem Relation Age of Onset  . Stroke Mother   . Breast cancer Mother 23       deceased 29  . Stroke Father   . Rectal cancer Son 40       currently 59     SOCIAL HISTORY:  reports that she quit smoking about 3 years ago. She has never  used smokeless tobacco. She reports that she does not drink alcohol or use drugs.The patient is divorced and lives in [place]. She has 4 adult children and recently retired from working in [employer].   ALLERGIES: Latex and Xarelto [rivaroxaban]   MEDICATIONS:  Current Outpatient Medications  Medication Sig Dispense Refill  . acetaminophen (TYLENOL) 500 MG tablet Take 500 mg by mouth every 6 (six) hours as needed.    . B Complex-C (SUPER B COMPLEX PO) Take 1 tablet by mouth daily.     Marland Kitchen CALCIUM PO Take 1 tablet by mouth daily.     . Cholecalciferol (VITAMIN  D-3) 1000 units CAPS Take 1,000 Units by mouth daily.     Marland Kitchen diltiazem (CARDIZEM CD) 120 MG 24 hr capsule Take 120 mg by mouth daily.    Marland Kitchen ELIQUIS 5 MG TABS tablet TAKE ONE TABLET BY MOUTH TWICE DAILY 60 tablet 6  . hydrochlorothiazide (HYDRODIURIL) 25 MG tablet Take 25 mg by mouth daily.    Marland Kitchen HYDROcodone-acetaminophen (NORCO/VICODIN) 5-325 MG tablet Take 1 tablet by mouth every 6 (six) hours as needed for moderate pain. 20 tablet 0  . JANUVIA 50 MG tablet Take 50 mg by mouth daily.     Marland Kitchen losartan (COZAAR) 50 MG tablet Take 50 mg by mouth daily.    . meloxicam (MOBIC) 15 MG tablet Take 15 mg by mouth daily.     . rosuvastatin (CRESTOR) 10 MG tablet Take 10 mg by mouth daily.     No current facility-administered medications for this encounter.      REVIEW OF SYSTEMS: On review of systems, the patient reports that she is doing well overall.  She denies any chest pain, shortness of breath, cough, fevers, chills, night sweats, unintended weight changes. She denies any bowel or bladder disturbances, and denies abdominal pain, nausea or vomiting. She denies any new musculoskeletal or joint aches or pains. A complete review of systems is obtained and is otherwise negative.     PHYSICAL EXAM:  Wt Readings from Last 3 Encounters:  08/01/17 159 lb (72.1 kg)  07/13/17 159 lb 11.2 oz (72.4 kg)  11/18/16 159 lb 9.6 oz (72.4 kg)   Temp Readings from Last 3 Encounters:  08/05/17 98.1 F (36.7 C)  07/13/17 98.6 F (37 C) (Oral)  05/18/17 (!) 97.5 F (36.4 C) (Oral)   BP Readings from Last 3 Encounters:  08/05/17 (!) 125/59  07/13/17 140/90  05/18/17 (!) 141/53   Pulse Readings from Last 3 Encounters:  08/05/17 91  07/13/17 (!) 103  05/18/17 75     In general this is a well appearing caucasian female in no acute distress. She is alert and oriented x4 and appropriate throughout the examination. HEENT reveals that the patient is normocephalic, atraumatic. EOMs are intact. PERRLA. Skin is  intact without any evidence of gross lesions. Cardiopulmonary assessment is negative for acute distress and he exhibits normal effort. Breast exam is deferred.    ECOG = 0  0 - Asymptomatic (Fully active, able to carry on all predisease activities without restriction)  1 - Symptomatic but completely ambulatory (Restricted in physically strenuous activity but ambulatory and able to carry out work of a light or sedentary nature. For example, light housework, office work)  2 - Symptomatic, <50% in bed during the day (Ambulatory and capable of all self care but unable to carry out any work activities. Up and about more than 50% of waking hours)  3 - Symptomatic, >50% in  bed, but not bedbound (Capable of only limited self-care, confined to bed or chair 50% or more of waking hours)  4 - Bedbound (Completely disabled. Cannot carry on any self-care. Totally confined to bed or chair)  5 - Death   Eustace Pen MM, Creech RH, Tormey DC, et al. [phone]). "Toxicity and response criteria of the [facility]". [place] Oncol. 5 (6): 649-55    LABORATORY DATA:  Lab Results  Component Value Date   WBC 7.1 07/13/2017   HGB 12.2 08/05/2017   HCT 36.0 08/05/2017   MCV 81.1 07/13/2017   PLT 230 07/13/2017   Lab Results  Component Value Date   NA 142 08/05/2017   K 3.3 (L) 08/05/2017   CL 100 (L) 08/05/2017   CO2 29 07/13/2017   Lab Results  Component Value Date   ALT 14 07/13/2017   AST 27 07/13/2017   ALKPHOS 92 07/13/2017   BILITOT 0.7 07/13/2017      RADIOGRAPHY: Nm Sentinel Node Inj-no Rpt (breast)  Result Date: 08/05/2017 Sulfur colloid was injected by the nuclear medicine technologist for melanoma sentinel node.   Mm Breast Surgical Specimen  Result Date: 08/05/2017 CLINICAL DATA:  Patient is post radioactive seed localization and subsequent surgical excision left breast malignancy. EXAM: SPECIMEN RADIOGRAPH OF THE LEFT BREAST COMPARISON:  Previous exam(s).  FINDINGS: Status post excision of the left breast. The radioactive seed and biopsy marker clip are present, completely intact, and were marked for pathology. Results were called to the OR at the time of dictation. IMPRESSION: Specimen radiograph of the left breast. Electronically Signed   By: Marin Olp M.D.   On: 08/05/2017 09:37   Mm Lt Radioactive Seed Loc Mammo Guide  Result Date: 08/04/2017 CLINICAL DATA:  Localization on the left EXAM: MAMMOGRAPHIC GUIDED RADIOACTIVE SEED LOCALIZATION OF THE LEFT BREAST COMPARISON:  Previous exam(s). FINDINGS: Patient presents for radioactive seed localization prior to surgery. I met with the patient and we discussed the procedure of seed localization including benefits and alternatives. We discussed the high likelihood of a successful procedure. We discussed the risks of the procedure including infection, bleeding, tissue injury and further surgery. We discussed the low dose of radioactivity involved in the procedure. Informed, written consent was given. The usual time-out protocol was performed immediately prior to the procedure. Using mammographic guidance, sterile technique, 1% lidocaine and an I-125 radioactive seed, biopsy clip was localized using a medial approach. The follow-up mammogram images confirm the seed in the expected location and were marked for the surgeon. Follow-up survey of the patient confirms presence of the radioactive seed. Order number of I-125 seed:  [id number]. Total activity:  2.778 millicuries reference Date: July 26, 2017 The patient tolerated the procedure well and was released from the [place]. She was given instructions regarding seed removal. IMPRESSION: Radioactive seed localization left breast. No apparent complications. Electronically Signed   By: Dorise Bullion III M.D   On: 08/04/2017 14:09       IMPRESSION/PLAN: 1. Stage IA, [id number] grade 1, ER/PR positive invasive ductal carcinoma of the left breast. Dr. Lisbeth Renshaw  discusses the final pathology findings and reviews the nature of invasive breast disease. Dr. Lisbeth Renshaw reviews the discrepancy with her T1b lesion clinically and that there was no residual disease at the time of her surgery. Despite this, he does discuss the rationale to consider adjuvant radiotherapy, versus consideration of antiestrogen therapy alone. We discussed the risks, benefits, short, and long term effects of radiotherapy, and the  patient is interested in proceeding with antiestrogen therapy alone. We will see her back as needed moving forward or if she has a change in her decision making for treatment.  In a visit lasting 25 minutes, greater than 50% of the time was spent face to face discussing her case, and coordinating the patient's care.  The above documentation reflects my direct findings during this shared patient visit. Please see the separate note by Dr. Lisbeth Renshaw on this date for the remainder of the patient's plan of care.    Carola Rhine, PAC

## 2017-09-02 ENCOUNTER — Ambulatory Visit: Payer: PPO

## 2017-09-02 ENCOUNTER — Ambulatory Visit: Payer: PPO | Admitting: Urology

## 2017-09-12 NOTE — Progress Notes (Signed)
[place]  [phone] [phone]     ID: Diana Cervantes DOB: Jan 02, 1941  MR#: [id number]  PNT#:[id number]  Patient Care Team: Velna Hatchet, MD as PCP - General (Internal Medicine) Magrinat, Virgie Dad, MD as Consulting Physician (Oncology) Alphonsa Overall, MD as Consulting Physician (General Surgery) Kyung Rudd, MD as Consulting Physician (Radiation Oncology) Despina Hick, MD as Referring Physician (Cardiology) OTHER MD:  CHIEF COMPLAINT: Estrogen receptor positive breast cancer  CURRENT TREATMENT: Anastrozole   HISTORY OF CURRENT ILLNESS: From the original intake note:  Diana Cervantes had routine screening mammography on 04/06/2017 at the breast center showing a possible change in the left breast. She underwent unilateral left diagnostic mammography with tomography and left breast ultrasonography at the breast center on 04/27/2017 showing: An irregular mass in the lower inner quadrant of the left breast measuring 0.8 cm.  There was no ultrasound correlate.  Ultrasound of the left axilla was benign.  On 06/13/2017 she underwent biopsy of the left breast lower inner quadrant lesion showing no evidence of malignancy ([id number]).  However a second biopsy of the left lower inner quadrant obtained 07/04/2017 showed ([id number]) and invasive ductal carcinoma, grade 1, estrogen receptor 95% positive, progesterone receptor 95% positive, both with strong staining intensity, with an MIB-1 of 1%, and no HER-2 amplification, the signals ratio being 1.71 and the number per cell 2.65.  The patient's subsequent history is as detailed below.  INTERVAL HISTORY: Diana Cervantes returns today for follow up and treatment of her estrogen receptor positive breast cancer accompanied by a friend. She underwent left lumpectomy and sentinel lymph node sampling on 08/05/2017 with pathology showing ([id number]): fibrocystic changes with no residual carcinoma identified.  Final inferior margins are clear. Two left axillary sentinel lymph nodes were negative for carcinoma.  Review of her renal biopsy shows that the largest tumor extension there was 0.5 cm.   REVIEW OF SYSTEMS: Diana Cervantes reports that she had no complications form her lumpectomy. She is able to exercise by walking 30 minutes per day, depending on the weather. She denies unusual headaches, visual changes, nausea, vomiting, or dizziness. There has been no unusual cough, phlegm production, or pleurisy. This been no change in bowel or bladder habits. She denies unexplained fatigue or unexplained weight loss, bleeding, rash, or fever. A detailed review of systems was otherwise stable.    PAST MEDICAL HISTORY: Past Medical History:  Diagnosis Date  . Atrial fibrillation ([place])   . Cataract   . Diabetes mellitus without complication ([place])   . Family history of breast cancer   . Family history of rectal cancer   . Genetic testing 07/21/17 & 07/28/17   Multi-Cancer panel (83 genes) @ [facility] - No pathogenic mutations detected  . Hypertension   . Stroke ([place])    paralyzed right side    PAST SURGICAL HISTORY: Past Surgical History:  Procedure Laterality Date  . ABDOMINAL HYSTERECTOMY    . BREAST LUMPECTOMY WITH RADIOACTIVE SEED AND SENTINEL LYMPH NODE BIOPSY Left 08/05/2017   Procedure: LEFT BREAST LUMPECTOMY WITH RADIOACTIVE SEED AND SENTINEL LYMPH NODE BIOPSY;  Surgeon: Alphonsa Overall, MD;  Location: [facility];  Service: General;  Laterality: Left;  . CHOLECYSTECTOMY    . IR GENERIC HISTORICAL  05/04/2016   IR ANGIO INTRA EXTRACRAN SEL COM CAROTID INNOMINATE UNI R MOD SED 05/04/2016 Diana Bras, MD [facility]  . IR GENERIC HISTORICAL  05/04/2016   IR ANGIO VERTEBRAL SEL VERTEBRAL UNI L MOD SED 05/04/2016 Willaim Rayas  Estanislado Pandy, MD [facility]  . IR GENERIC HISTORICAL  05/04/2016   IR PERCUTANEOUS ART THROMBECTOMY/INFUSION INTRACRANIAL INC DIAG ANGIO 05/04/2016 Diana Bras, MD  [facility]  . IR GENERIC HISTORICAL  06/22/2016   IR RADIOLOGIST EVAL & MGMT 06/22/2016 [facility]  . RADIOLOGY WITH ANESTHESIA N/A 05/04/2016   Procedure: RADIOLOGY WITH ANESTHESIA;  Surgeon: Diana Bras, MD;  Location: [facility];  Service: Radiology;  Laterality: N/A;    FAMILY HISTORY Family History  Problem Relation Age of Onset  . Stroke Mother   . Breast cancer Mother 38       deceased 44  . Stroke Father   . Rectal cancer Son 21       currently 26  The patient's father died at 103 years old from a stroke. Her mother was diagnosed with breast cancer at 75 years old. She died at the age of 55 from a stroke. The patient does not have any brothers or sisters. She is unaware of anyone else in her family that had breast cancer.  GYNECOLOGIC HISTORY:  No LMP recorded. Patient has had a hysterectomy. Menarche: 77 years old Age at first live birth: 77 years old [place] P 4 LMP before 2000 Contraceptive none HRT no  Hysterectomy? 2000 SO? 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 She lives with her daughter, Burnice Logan, 36, and Tywana's husband. 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 Trey has three sons as well. Legrand Como, 52, lives in [place] and works at [employer]. Scott, 72, lives in [place], [place], and is on disability. Her youngest, Marcello Moores, 97, lives in [place], [place], and he is a Training and development officer. The patient has 10 grandchildren and >10 great-grandchildren. She does not attend church.    ADVANCED DIRECTIVES: Not in place; at the 07/13/2017 visit the patient was given the appropriate forms to complete and notarized at her discretion   HEALTH MAINTENANCE: Social History   Tobacco Use  . Smoking status: Former Smoker    Last attempt to quit: 10/21/2013    Years since quitting: 3.9  . Smokeless tobacco: Never Used  . Tobacco comment: Quit 2 years ago - uses [employer]  Substance Use Topics  . Alcohol use: No    Alcohol/week: 0.0  oz  . Drug use: No     Colonoscopy: 2009 at [facility]  PAP:  Bone density: 2018, some bone loss   Allergies  Allergen Reactions  . Latex Other (See Comments)    itching  . Xarelto [Rivaroxaban] Other (See Comments)    Made patient "not feel like herself"    Current Outpatient Medications  Medication Sig Dispense Refill  . acetaminophen (TYLENOL) 500 MG tablet Take 500 mg by mouth every 6 (six) hours as needed.    . B Complex-C (SUPER B COMPLEX PO) Take 1 tablet by mouth daily.     Marland Kitchen CALCIUM PO Take 1 tablet by mouth daily.     . Cholecalciferol (VITAMIN D-3) 1000 units CAPS Take 1,000 Units by mouth daily.     Marland Kitchen diltiazem (CARDIZEM CD) 120 MG 24 hr capsule Take 120 mg by mouth daily.    Marland Kitchen ELIQUIS 5 MG TABS tablet TAKE ONE TABLET BY MOUTH TWICE DAILY 60 tablet 6  . hydrochlorothiazide (HYDRODIURIL) 25 MG tablet Take 25 mg by mouth daily.    Marland Kitchen HYDROcodone-acetaminophen (NORCO/VICODIN) 5-325 MG tablet Take 1 tablet by mouth every 6 (six) hours as needed for moderate pain.  20 tablet 0  . JANUVIA 50 MG tablet Take 50 mg by mouth daily.     Marland Kitchen losartan (COZAAR) 50 MG tablet Take 50 mg by mouth daily.    . meloxicam (MOBIC) 15 MG tablet Take 15 mg by mouth daily.     . rosuvastatin (CRESTOR) 10 MG tablet Take 10 mg by mouth daily.     No current facility-administered medications for this visit.     OBJECTIVE: Older white woman who appears stated age  77:   09/14/17 1142  BP: 130/73  Pulse: 93  Resp: 18  Temp: 98.4 F (36.9 C)  SpO2: 98%     Body mass index is 29.06 kg/m.   Wt Readings from Last 3 Encounters:  09/14/17 158 lb 14.4 oz (72.1 kg)  09/01/17 154 lb 6.4 oz (70 kg)  08/01/17 159 lb (72.1 kg)      ECOG FS:1 - Symptomatic but completely ambulatory  Sclerae unicteric, EOMs intact Oropharynx clear and moist No cervical or supraclavicular adenopathy Lungs no rales or rhonchi Heart regular rate and rhythm Abd soft, nontender, positive bowel sounds MSK no focal  spinal tenderness, no upper extremity lymphedema Neuro: nonfocal, well oriented, appropriate affect Breasts: The right breast is benign.  The left breast has undergone lumpectomy and axillary lymph node sampling.  The incisions are healing very nicely.  There is no erythema, swelling, or dehiscence.  The cosmetic result is good.  Both axillae are benign.  LAB RESULTS:  CMP     Component Value Date/Time   NA 142 08/05/2017 0756   K 3.3 (L) 08/05/2017 0756   CL 100 (L) 08/05/2017 0756   CO2 29 07/13/2017 0839   GLUCOSE 101 (H) 08/05/2017 0756   BUN 12 08/05/2017 0756   CREATININE 0.60 08/05/2017 0756   CREATININE 0.81 07/13/2017 0839   CALCIUM 8.0 (L) 07/13/2017 0839   PROT 7.2 07/13/2017 0839   ALBUMIN 4.0 07/13/2017 0839   AST 27 07/13/2017 0839   ALT 14 07/13/2017 0839   ALKPHOS 92 07/13/2017 0839   BILITOT 0.7 07/13/2017 0839   GFRNONAA >60 07/13/2017 0839   GFRAA >60 07/13/2017 0839    No results found for: TOTALPROTELP, ALBUMINELP, A1GS, A2GS, BETS, BETA2SER, GAMS, MSPIKE, SPEI  No results found for: KPAFRELGTCHN, LAMBDASER, [facility]  Lab Results  Component Value Date   WBC 7.1 07/13/2017   NEUTROABS 4.7 07/13/2017   HGB 12.2 08/05/2017   HCT 36.0 08/05/2017   MCV 81.1 07/13/2017   PLT 230 07/13/2017    '@LASTCHEMISTRY' @  No results found for: LABCA2  No components found for: [id number]  No results for input(s): INR in the last 168 hours.  No results found for: LABCA2  No results found for: WUJ811  No results found for: BJY782  No results found for: NFA213  No results found for: CA2729  No components found for: HGQUANT  No results found for: CEA1 / No results found for: CEA1   No results found for: AFPTUMOR  No results found for: CHROMOGRNA  No results found for: PSA1  No visits with results within 3 Day(s) from this visit.  Latest known visit with results is:  Admission on 08/05/2017, Discharged on 08/05/2017  Component Date Value Ref  Range Status  . Glucose-Capillary 08/05/2017 114* 65 - 99 mg/dL Final  . Sodium 08/05/2017 142  135 - 145 mmol/L Final  . Potassium 08/05/2017 3.3* 3.5 - 5.1 mmol/L Final  . Chloride 08/05/2017 100* 101 - 111 mmol/L Final  .  BUN 08/05/2017 12  6 - 20 mg/dL Final  . Creatinine, Ser 08/05/2017 0.60  0.44 - 1.00 mg/dL Final  . Glucose, Bld 08/05/2017 101* 65 - 99 mg/dL Final  . Calcium, Ion 08/05/2017 0.99* 1.15 - 1.40 mmol/L Final  . TCO2 08/05/2017 25  22 - 32 mmol/L Final  . Hemoglobin 08/05/2017 12.2  12.0 - 15.0 g/dL Final  . HCT 08/05/2017 36.0  36.0 - 46.0 % Final    (this displays the last labs from the last 3 days)  No results found for: TOTALPROTELP, ALBUMINELP, A1GS, A2GS, BETS, BETA2SER, GAMS, MSPIKE, SPEI (this displays SPEP labs)  No results found for: KPAFRELGTCHN, LAMBDASER, KAPLAMBRATIO (kappa/lambda light chains)  No results found for: HGBA, HGBA2QUANT, HGBFQUANT, HGBSQUAN (Hemoglobinopathy evaluation)   No results found for: LDH  No results found for: IRON, TIBC, IRONPCTSAT (Iron and TIBC)  No results found for: FERRITIN  Urinalysis    Component Value Date/Time   COLORURINE YELLOW 05/04/2016 [place] 05/04/2016 2231   LABSPEC 1.016 05/04/2016 2231   PHURINE 5.0 05/04/2016 2231   GLUCOSEU NEGATIVE 05/04/2016 2231   HGBUR SMALL (A) 05/04/2016 2231   BILIRUBINUR NEGATIVE 05/04/2016 2231   KETONESUR NEGATIVE 05/04/2016 2231   PROTEINUR NEGATIVE 05/04/2016 2231   NITRITE NEGATIVE 05/04/2016 2231   LEUKOCYTESUR TRACE (A) 05/04/2016 2231     STUDIES: No results found.  ELIGIBLE FOR AVAILABLE RESEARCH PROTOCOL: No  ASSESSMENT: 77 y.o. [place] woman status post left breast lower inner quadrant biopsy 07/04/2017 for a clinical T1b N0, stage IA invasive ductal carcinoma, grade 1, estrogen and progesterone receptor positive, HER-2 not amplified, with an MIB-1 of 1%  (1) status post left lumpectomy and sentinel lymph node sampling on  08/05/2017 with pT0 pN0  (2) given the size of the tumor, no Oncotype was obtained and no chemotherapy planned  (3) the patient opted against adjuvant radiation  (4) anastrozole started 09/14/2017.  (5) genetics testing 07/21/2017 through the multi-cancer panel offered by [facility] on 07/21/2017 found no deleterious mutations in ALK, APC, ATM, AXIN2, BAP1, BARD1, BLM, BMPR1A, BRCA1, BRCA2, BRIP1, CASR, CDC73, CDH1, CDK4, CDKN1B, CDKN1C,CDKN2A (p14ARF), CDKN2A (p16INK4a), CEBPA, CHEK2, CTNNA1, DICER1, DIS3L2, EPCAM*, FH, FLCN, GATA2, GPC3, GREM1*, HRAS, KIT, MAX, MEN1, MET, MLH1, MSH2, MSH3, MSH6, MUTYH, NBN, NF1, NF2, PALB2, PDGFRA, PHOX2B*, PMS2, POLD1, POLE, POT1, PRKAR1A, PTCH1, PTEN, RAD50, RAD51C, RAD51D, RB1, RECQL4, RET, RUNX1, SDHAF2, SDHB, SDHC, SDHD, SMAD4, SMARCA4, SMARCB1, SMARCE1, STK11, SUFU, TERC, TERT, TMEM127, TP53, TSC1, TSC2, VHL, WRN*, WT1.The following genes were evaluated for sequence changes only:EGFR*, HOXB13*, MITF*, NTHL1*, SDHA  (a) notevariant of uncertain significance in 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  PLAN: Kaylise did remarkably well with her surgery.  I am not uncomfortable with her decision to forego radiation given the very small size of her tumor.  However since she did not have radiation I do think it is a good idea for her to take antiestrogens.  Today we discussed anastrozole in detail and she has a good understanding of the possible toxicities, side effects and complications.  She had a bone density 06/10/2014 showing a T score of -0.1, which is normal.  I am going to obtain a vitamin D level with her next set of labs.  I asked Alyssia to call me with any problems but assuming all goes well she will return to see me in about 3 months.  At that point we will start routine long-term follow-up  She will call with any other issues that may develop  before that visit.  Magrinat, Virgie Dad, MD  09/14/17 11:54 AM Medical Oncology and Hematology [facility] [address] [place], [place] [address] Tel. [phone]    Fax. [phone]  This document serves as a record of services personally performed by Lurline Del, MD. It was created on his behalf by Sheron Nightingale, a trained medical scribe. The creation of this record is based on the scribe's personal observations and the provider's statements to them.   I have reviewed the above documentation for accuracy and completeness, and I agree with the above.

## 2017-09-13 ENCOUNTER — Other Ambulatory Visit: Payer: Self-pay

## 2017-09-13 DIAGNOSIS — C50312 Malignant neoplasm of lower-inner quadrant of left female breast: Secondary | ICD-10-CM

## 2017-09-13 DIAGNOSIS — Z170 Estrogen receptor positive status [ER+]: Principal | ICD-10-CM

## 2017-09-14 ENCOUNTER — Inpatient Hospital Stay: Payer: PPO | Attending: Oncology

## 2017-09-14 ENCOUNTER — Telehealth: Payer: Self-pay | Admitting: Oncology

## 2017-09-14 ENCOUNTER — Inpatient Hospital Stay (HOSPITAL_BASED_OUTPATIENT_CLINIC_OR_DEPARTMENT_OTHER): Payer: PPO | Admitting: Oncology

## 2017-09-14 VITALS — BP 130/73 | HR 93 | Temp 98.4°F | Resp 18 | Ht 62.0 in | Wt 158.9 lb

## 2017-09-14 DIAGNOSIS — Z79899 Other long term (current) drug therapy: Secondary | ICD-10-CM | POA: Diagnosis not present

## 2017-09-14 DIAGNOSIS — Z170 Estrogen receptor positive status [ER+]: Secondary | ICD-10-CM | POA: Diagnosis not present

## 2017-09-14 DIAGNOSIS — C50312 Malignant neoplasm of lower-inner quadrant of left female breast: Secondary | ICD-10-CM

## 2017-09-14 DIAGNOSIS — E119 Type 2 diabetes mellitus without complications: Secondary | ICD-10-CM | POA: Insufficient documentation

## 2017-09-14 DIAGNOSIS — I69851 Hemiplegia and hemiparesis following other cerebrovascular disease affecting right dominant side: Secondary | ICD-10-CM

## 2017-09-14 DIAGNOSIS — Z803 Family history of malignant neoplasm of breast: Secondary | ICD-10-CM | POA: Insufficient documentation

## 2017-09-14 DIAGNOSIS — Z7901 Long term (current) use of anticoagulants: Secondary | ICD-10-CM | POA: Insufficient documentation

## 2017-09-14 DIAGNOSIS — I4891 Unspecified atrial fibrillation: Secondary | ICD-10-CM | POA: Diagnosis not present

## 2017-09-14 DIAGNOSIS — I10 Essential (primary) hypertension: Secondary | ICD-10-CM | POA: Diagnosis not present

## 2017-09-14 DIAGNOSIS — Z87891 Personal history of nicotine dependence: Secondary | ICD-10-CM | POA: Insufficient documentation

## 2017-09-14 LAB — CBC WITH DIFFERENTIAL (CANCER CENTER ONLY)
Basophils Absolute: 0.1 10*3/uL (ref 0.0–0.1)
Basophils Relative: 1 %
EOS ABS: 0.2 10*3/uL (ref 0.0–0.5)
EOS PCT: 3 %
HEMATOCRIT: 36.8 % (ref 34.8–46.6)
Hemoglobin: 11.9 g/dL (ref 11.6–15.9)
Lymphocytes Relative: 26 %
Lymphs Abs: 1.9 10*3/uL (ref 0.9–3.3)
MCH: 25.5 pg (ref 25.1–34.0)
MCHC: 32.3 g/dL (ref 31.5–36.0)
MCV: 78.8 fL — ABNORMAL LOW (ref 79.5–101.0)
MONO ABS: 0.6 10*3/uL (ref 0.1–0.9)
MONOS PCT: 8 %
Neutro Abs: 4.4 10*3/uL (ref 1.5–6.5)
Neutrophils Relative %: 62 %
Platelet Count: 221 10*3/uL (ref 145–400)
RBC: 4.67 MIL/uL (ref 3.70–5.45)
RDW: 14.6 % — AB (ref 11.2–14.5)
WBC Count: 7.2 10*3/uL (ref 3.9–10.3)

## 2017-09-14 LAB — CMP (CANCER CENTER ONLY)
ALBUMIN: 4.1 g/dL (ref 3.5–5.0)
ALK PHOS: 94 U/L (ref 40–150)
ALT: 14 U/L (ref 0–55)
AST: 22 U/L (ref 5–34)
Anion gap: 9 (ref 3–11)
BUN: 13 mg/dL (ref 7–26)
CALCIUM: 8.3 mg/dL — AB (ref 8.4–10.4)
CO2: 27 mmol/L (ref 22–29)
CREATININE: 0.8 mg/dL (ref 0.60–1.10)
Chloride: 103 mmol/L (ref 98–109)
GFR, Est AFR Am: >60 mL/min (ref >60)
GFR, Estimated: >60 mL/min (ref >60)
GLUCOSE: 131 mg/dL (ref 70–140)
Potassium: 3.5 mmol/L (ref 3.5–5.1)
Sodium: 139 mmol/L (ref 136–145)
Total Bilirubin: 0.6 mg/dL (ref 0.2–1.2)
Total Protein: 7.4 g/dL (ref 6.4–8.3)

## 2017-09-14 MED ORDER — ANASTROZOLE 1 MG PO TABS: 1.0000 mg | ORAL_TABLET | Freq: Every day | ORAL | 4 refills | Status: AC

## 2017-09-14 NOTE — Telephone Encounter (Signed)
Gave avs and calendar ° °

## 2017-09-21 ENCOUNTER — Ambulatory Visit: Payer: PPO | Admitting: Oncology

## 2017-09-21 ENCOUNTER — Other Ambulatory Visit: Payer: PPO

## 2017-11-16 ENCOUNTER — Telehealth: Payer: Self-pay | Admitting: Oncology

## 2017-11-16 NOTE — Telephone Encounter (Signed)
Patient called to cancel °

## 2017-11-23 ENCOUNTER — Other Ambulatory Visit: Payer: PPO

## 2017-11-23 ENCOUNTER — Ambulatory Visit: Payer: PPO | Admitting: Oncology

## 2018-02-10 ENCOUNTER — Telehealth: Payer: Self-pay

## 2018-02-10 NOTE — Telephone Encounter (Signed)
Spoke with patient to remind of SCP visit with [place] on 02/16/18 at 10 am.  Patient said she will come to appt.

## 2018-02-16 ENCOUNTER — Telehealth: Payer: Self-pay | Admitting: Oncology

## 2018-02-16 ENCOUNTER — Inpatient Hospital Stay: Payer: Medicare PPO | Attending: Adult Health | Admitting: Adult Health

## 2018-02-16 ENCOUNTER — Encounter: Payer: Self-pay | Admitting: Adult Health

## 2018-02-16 VITALS — BP 129/65 | HR 86 | Temp 97.8°F | Resp 18 | Ht 62.0 in | Wt 159.5 lb

## 2018-02-16 DIAGNOSIS — Z79811 Long term (current) use of aromatase inhibitors: Secondary | ICD-10-CM | POA: Diagnosis not present

## 2018-02-16 DIAGNOSIS — E119 Type 2 diabetes mellitus without complications: Secondary | ICD-10-CM

## 2018-02-16 DIAGNOSIS — Z170 Estrogen receptor positive status [ER+]: Secondary | ICD-10-CM | POA: Diagnosis not present

## 2018-02-16 DIAGNOSIS — I69851 Hemiplegia and hemiparesis following other cerebrovascular disease affecting right dominant side: Secondary | ICD-10-CM | POA: Diagnosis not present

## 2018-02-16 DIAGNOSIS — C50312 Malignant neoplasm of lower-inner quadrant of left female breast: Secondary | ICD-10-CM | POA: Diagnosis present

## 2018-02-16 DIAGNOSIS — Z87891 Personal history of nicotine dependence: Secondary | ICD-10-CM | POA: Diagnosis not present

## 2018-02-16 DIAGNOSIS — I4891 Unspecified atrial fibrillation: Secondary | ICD-10-CM

## 2018-02-16 DIAGNOSIS — Z79899 Other long term (current) drug therapy: Secondary | ICD-10-CM | POA: Diagnosis not present

## 2018-02-16 DIAGNOSIS — I10 Essential (primary) hypertension: Secondary | ICD-10-CM

## 2018-02-16 DIAGNOSIS — Z803 Family history of malignant neoplasm of breast: Secondary | ICD-10-CM

## 2018-02-16 DIAGNOSIS — Z7901 Long term (current) use of anticoagulants: Secondary | ICD-10-CM | POA: Diagnosis not present

## 2018-02-16 DIAGNOSIS — Z800 Family history of malignant neoplasm of digestive organs: Secondary | ICD-10-CM | POA: Diagnosis not present

## 2018-02-16 NOTE — Telephone Encounter (Signed)
Gave patient avs and calendar.   °

## 2018-02-16 NOTE — Progress Notes (Signed)
CLINIC:  Survivorship   REASON FOR VISIT:  Routine follow-up post-treatment for a recent history of breast cancer.  BRIEF ONCOLOGIC HISTORY:    Malignant neoplasm of lower-inner quadrant of left breast in female, estrogen receptor positive ([place])   07/04/2017 Initial Biopsy    left breast lower inner quadrant biopsy for a clinical T1b N0, stage IA invasive ductal carcinoma, grade 1, estrogen and progesterone receptor positive, HER-2 not amplified, with an MIB-1 of 1%    07/12/2017 Initial Diagnosis    Malignant neoplasm of lower-inner quadrant of left breast in female, estrogen receptor positive ([place])    07/21/2017 Genetic Testing    genetics testing through the multi-cancer panel offered by [facility] on 07/21/2017 found no deleterious mutations in ALK, APC, ATM, AXIN2, BAP1, BARD1, BLM, BMPR1A, BRCA1, BRCA2, BRIP1, CASR, CDC73, CDH1, CDK4, CDKN1B, CDKN1C,CDKN2A (p14ARF), CDKN2A (p16INK4a), CEBPA, CHEK2, CTNNA1, DICER1, DIS3L2, EPCAM*, FH, FLCN, GATA2, GPC3, GREM1*, HRAS, KIT, MAX, MEN1, MET, MLH1, MSH2, MSH3, MSH6, MUTYH, NBN, NF1, NF2, PALB2, PDGFRA, PHOX2B*, PMS2, POLD1, POLE, POT1, PRKAR1A, PTCH1, PTEN, RAD50, RAD51C, RAD51D, RB1, RECQL4, RET, RUNX1, SDHAF2, SDHB, SDHC, SDHD, SMAD4, SMARCA4, SMARCB1, SMARCE1, STK11, SUFU, TERC, TERT, TMEM127, TP53, TSC1, TSC2, VHL, WRN*, WT1.The following genes were evaluated for sequence changes only:EGFR*, HOXB13*, MITF*, NTHL1*, SDHA             (a) notevariant of uncertain significance in 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    08/05/2017 Surgery    left lumpectomy and sentinel lymph node sampling with pT0 pN0 (2SLN negative)    09/14/2017 -  Anti-estrogen oral therapy    Anastrozole daily     INTERVAL HISTORY:  Ms. Dershem presents to the [facility] today for our initial meeting to review her survivorship care plan detailing her treatment course for breast cancer, as well as monitoring long-term side effects of that treatment, education  regarding health maintenance, screening, and overall wellness and health promotion.     Overall, Ms. Faulk reports feeling quite well.  She is accompanied by her son Nicki Reaper.  She is tolerating this medication well.  She denies arthralgias, vaginal dryness, or hot flashes.  She walks every morning for about thirty minutes in the morning.    She is former smoker quit in 2014.  She smoked 1/2 ppd for about 54 years.      REVIEW OF SYSTEMS:  Review of Systems  Constitutional: Negative for appetite change, chills, fatigue, fever and unexpected weight change.  HENT:   Negative for hearing loss, lump/mass and trouble swallowing.   Eyes: Negative for eye problems and icterus.  Respiratory: Negative for chest tightness, cough and wheezing.   Cardiovascular: Negative for chest pain, leg swelling and palpitations.  Gastrointestinal: Negative for abdominal distention, abdominal pain, constipation, diarrhea, nausea and vomiting.  Endocrine: Negative for hot flashes.  Musculoskeletal: Negative for arthralgias.  Skin: Negative for itching and rash.  Neurological: Negative for dizziness, extremity weakness, headaches and numbness.  Hematological: Negative for adenopathy. Does not bruise/bleed easily.  Psychiatric/Behavioral: Negative for depression. The patient is not nervous/anxious.    Breast: Denies any new nodularity, masses, tenderness, nipple changes, or nipple discharge.      ONCOLOGY TREATMENT TEAM:  1. Surgeon:  Dr. Lucia Gaskins at [facility] 2. Medical Oncologist: Dr. Jana Hakim  3. Radiation Oncologist: Dr. Lisbeth Renshaw    PAST MEDICAL/SURGICAL HISTORY:  Past Medical History:  Diagnosis Date  . Atrial fibrillation ([place])   . Cataract   . Diabetes mellitus without complication ([place])   . Family  history of breast cancer   . Family history of rectal cancer   . Genetic testing 07/21/17 & 07/28/17   Multi-Cancer panel (83 genes) @ [facility] - No pathogenic mutations detected  .  Hypertension   . Stroke [facility])    paralyzed right side   Past Surgical History:  Procedure Laterality Date  . ABDOMINAL HYSTERECTOMY    . BREAST LUMPECTOMY WITH RADIOACTIVE SEED AND SENTINEL LYMPH NODE BIOPSY Left 08/05/2017   Procedure: LEFT BREAST LUMPECTOMY WITH RADIOACTIVE SEED AND SENTINEL LYMPH NODE BIOPSY;  Surgeon: Alphonsa Overall, MD;  Location: [facility];  Service: General;  Laterality: Left;  . CHOLECYSTECTOMY    . IR GENERIC HISTORICAL  05/04/2016   IR ANGIO INTRA EXTRACRAN SEL COM CAROTID INNOMINATE UNI R MOD SED 05/04/2016 Luanne Bras, MD [facility]  . IR GENERIC HISTORICAL  05/04/2016   IR ANGIO VERTEBRAL SEL VERTEBRAL UNI L MOD SED 05/04/2016 Luanne Bras, MD [facility]  . IR GENERIC HISTORICAL  05/04/2016   IR PERCUTANEOUS ART THROMBECTOMY/INFUSION INTRACRANIAL INC DIAG ANGIO 05/04/2016 Luanne Bras, MD [facility]  . IR GENERIC HISTORICAL  06/22/2016   IR RADIOLOGIST EVAL & MGMT 06/22/2016 [facility]  . RADIOLOGY WITH ANESTHESIA N/A 05/04/2016   Procedure: RADIOLOGY WITH ANESTHESIA;  Surgeon: Luanne Bras, MD;  Location: [facility];  Service: Radiology;  Laterality: N/A;     ALLERGIES:  Allergies  Allergen Reactions  . Latex Other (See Comments)    itching  . Xarelto [Rivaroxaban] Other (See Comments)    Made patient "not feel like herself"     CURRENT MEDICATIONS:  Outpatient Encounter Medications as of 02/16/2018  Medication Sig Note  . acetaminophen (TYLENOL) 500 MG tablet Take 500 mg by mouth every 6 (six) hours as needed.   Marland Kitchen anastrozole (ARIMIDEX) 1 MG tablet Take 1 tablet (1 mg total) by mouth daily.   . B Complex-C (SUPER B COMPLEX PO) Take 1 tablet by mouth daily.  05/04/2016: OTC  . CALCIUM PO Take 1 tablet by mouth daily.  05/04/2016: OTC  . Cholecalciferol (VITAMIN D-3) 1000 units CAPS Take 1,000 Units by mouth daily.  05/04/2016: OTC  . diltiazem (CARDIZEM CD) 120 MG 24 hr capsule Take 120 mg by mouth daily.   Marland Kitchen ELIQUIS  5 MG TABS tablet TAKE ONE TABLET BY MOUTH TWICE DAILY   . hydrochlorothiazide (HYDRODIURIL) 25 MG tablet Take 25 mg by mouth daily.   Marland Kitchen HYDROcodone-acetaminophen (NORCO/VICODIN) 5-325 MG tablet Take 1 tablet by mouth every 6 (six) hours as needed for moderate pain.   Marland Kitchen losartan (COZAAR) 50 MG tablet Take 50 mg by mouth daily.   . meloxicam (MOBIC) 15 MG tablet Take 15 mg by mouth daily.    . rosuvastatin (CRESTOR) 10 MG tablet Take 10 mg by mouth daily. 05/04/2016: LF (90 days) on 02/04/16, per [facility]  . [DISCONTINUED] JANUVIA 50 MG tablet Take 50 mg by mouth daily.     No facility-administered encounter medications on file as of 02/16/2018.      ONCOLOGIC FAMILY HISTORY:  Family History  Problem Relation Age of Onset  . Stroke Mother   . Breast cancer Mother 8       deceased 14  . Stroke Father   . Rectal cancer Son 59       currently 56     GENETIC COUNSELING/TESTING: Noted, one VUS   SOCIAL HISTORY:  Social History   Socioeconomic History  . Marital status: Divorced    Spouse name: Not on  file  . Number of children: Not on file  . Years of education: Not on file  . Highest education level: Not on file  Occupational History  . Not on file  Social Needs  . Financial resource strain: Not on file  . Food insecurity:    Worry: Not on file    Inability: Not on file  . Transportation needs:    Medical: Not on file    Non-medical: Not on file  Tobacco Use  . Smoking status: Former Smoker    Last attempt to quit: 10/21/2013    Years since quitting: 4.3  . Smokeless tobacco: Never Used  . Tobacco comment: Quit 2 years ago - uses [employer]  Substance and Sexual Activity  . Alcohol use: No    Alcohol/week: 0.0 standard drinks  . Drug use: No  . Sexual activity: Not on file  Lifestyle  . Physical activity:    Days per week: Not on file    Minutes per session: Not on file  . Stress: Not on file  Relationships  . Social connections:    Talks on phone: Not on file     Gets together: Not on file    Attends religious service: Not on file    Active member of club or organization: Not on file    Attends meetings of clubs or organizations: Not on file    Relationship status: Not on file  . Intimate partner violence:    Fear of current or ex partner: Not on file    Emotionally abused: Not on file    Physically abused: Not on file    Forced sexual activity: Not on file  Other Topics Concern  . Not on file  Social History Narrative  . Not on file      PHYSICAL EXAMINATION:  Vital Signs:   Vitals:   02/16/18 0938  BP: 129/65  Pulse: 86  Resp: 18  Temp: 97.8 F (36.6 C)  SpO2: 98%   Filed Weights   02/16/18 0938  Weight: 159 lb 8 oz (72.3 kg)   General: Well-nourished, well-appearing female in no acute distress.  She is accompanied in clinic by her son today.   HEENT: Head is normocephalic.  Pupils equal and reactive to light. Conjunctivae clear without exudate.  Sclerae anicteric. Oral mucosa is pink, moist.  Oropharynx is pink without lesions or erythema.  Lymph: No cervical, supraclavicular, or infraclavicular lymphadenopathy noted on palpation.  Cardiovascular: Regular rate and rhythm.Marland Kitchen Respiratory: Clear to auscultation bilaterally. Chest expansion symmetric; breathing non-labored.  Breasts: left breast s/p lumpectomy, mild amt of scar tissue present, no nodules, or masses, right breast without nodules, masses, skin or nipple changes GI: Abdomen soft and round; non-tender, non-distended. Bowel sounds normoactive.  GU: Deferred.  Neuro: No focal deficits. Steady gait.  Psych: Mood and affect normal and appropriate for situation.  Extremities: No edema. MSK: No focal spinal tenderness to palpation.  Full range of motion in bilateral upper extremities Skin: Warm and dry.  LABORATORY DATA:  None for this visit.  DIAGNOSTIC IMAGING:  None for this visit.      ASSESSMENT AND PLAN:  Ms.. Preslar is a pleasant 77 y.o. female with Stage  IA left breast invasive ductal carcinoma, ER+/PR+/HER2-, diagnosed in 07/2017, treated with lumpectomy, and anti-estrogen therapy with Anastrozole beginning in 07/2017.  She presents to the Survivorship Clinic for our initial meeting and routine follow-up post-completion of treatment for breast cancer.    1. Stage IA left breast  cancer:  Ms. Pfluger is continuing to recover from definitive treatment for breast cancer. She will follow-up with her medical oncologist, Dr. Jana Hakim in 4 months with history and physical exam per surveillance protocol.  She will continue her anti-estrogen therapy with Anastrozole. Thus far, she is tolerating the Anastozole well, with minimal side effects. She was instructed to make Dr. Lindi Adie or myself aware if she begins to experience any worsening side effects of the medication and I could see her back in clinic to help manage those side effects, as needed. Though the incidence is low, there is an associated risk of endometrial cancer with anti-estrogen therapies like Tamoxifen.  Ms. Taborn was encouraged to contact Dr. Carrington Clamp or myself with any vaginal bleeding while taking Tamoxifen. Other side effects of Tamoxifen were again reviewed with her as well. Today, a comprehensive survivorship care plan and treatment summary was reviewed with the patient today detailing her breast cancer diagnosis, treatment course, potential late/long-term effects of treatment, appropriate follow-up care with recommendations for the future, and patient education resources.  A copy of this summary, along with a letter will be sent to the patient's primary care provider via mail/fax/In Basket message after today's visit.    2. Bone health:  Given Ms. Hurd's age/history of breast cancer and her current treatment regimen including anti-estrogen therapy with Anastrozole, she is at risk for bone demineralization.  She is going to undergo repeat DEXA with her PCP and.  She was given education  on specific activities to promote bone health.  3. Cancer screening:  Due to Ms. Birmingham's history and her age, she should receive screening for skin cancers, colon cancer.  She does not quite meet criteria for lung cancer screening. The information and recommendations are listed on the patient's comprehensive care plan/treatment summary and were reviewed in detail with the patient.    4. Health maintenance and wellness promotion: Ms. Cratty was encouraged to consume 5-7 servings of fruits and vegetables per day. We reviewed the "Nutrition Rainbow" handout, as well as the handout "Take Control of Your Health and Reduce Your Cancer Risk" from the [employer].  She was also encouraged to engage in moderate to vigorous exercise for 30 minutes per day most days of the week. We discussed the [employer] fitness program, which is designed for cancer survivors to help them become more physically fit after cancer treatments.  She was instructed to limit her alcohol consumption and continue to abstain from tobacco use.     5. Support services/counseling: It is not uncommon for this period of the patient's cancer care trajectory to be one of many emotions and stressors.  We discussed an opportunity for her to participate in the next session of [facility] ("Finding Your New Normal") support group series designed for patients after they have completed treatment.   Ms. Marmol was encouraged to take advantage of our many other support services programs, support groups, and/or counseling in coping with her new life as a cancer survivor after completing anti-cancer treatment. She was given information regarding our available services and encouraged to contact me with any questions or for help enrolling in any of our support group/programs.    Dispo:   -Return to cancer center in 4-6 months for f/u with Dr. Jana Hakim -Mammogram due in 03/2018 -Follow up with surgery per Dr. Lucia Gaskins -She is welcome to return  back to the Survivorship Clinic at any time; no additional follow-up needed at this time.  -Consider referral back to survivorship  as a long-term survivor for continued surveillance  A total of (30) minutes of face-to-face time was spent with this patient with greater than 50% of that time in counseling and care-coordination.   Gardenia Phlegm, NP Survivorship Program [facility] [phone]   Note: PRIMARY CARE PROVIDER Velna Hatchet, [place] [phone]

## 2018-06-20 ENCOUNTER — Telehealth: Payer: Self-pay | Admitting: Oncology

## 2018-06-20 NOTE — Telephone Encounter (Signed)
R/s appt per 1/21 sch message - pt not feeling well and son is also sick r/s 3 weeks out. Pt is aware of new appt date and time

## 2018-06-22 ENCOUNTER — Inpatient Hospital Stay: Payer: Medicare PPO | Admitting: Oncology

## 2018-06-22 ENCOUNTER — Inpatient Hospital Stay: Payer: Medicare PPO

## 2018-07-13 ENCOUNTER — Inpatient Hospital Stay: Payer: Medicare PPO

## 2018-07-13 ENCOUNTER — Inpatient Hospital Stay: Payer: Medicare PPO | Admitting: Oncology

## 2018-08-25 IMAGING — CT CT HEAD W/O CM
1 series · 16 of 30 positions shown, 20 images · non-contrast
Comparison: 05/08/2016

CLINICAL DATA: CVA follow-up.

EXAM:
CT HEAD WITHOUT CONTRAST
TECHNIQUE: Contiguous axial images were obtained from the base of the skull
through the vertex without intravenous contrast.

[Series 2: head w/(date) · axial · 0.42mm/px · z∈[-70,+64]mm · 16 of 30 slices shown, 20 images]
[im 2/30  brain]
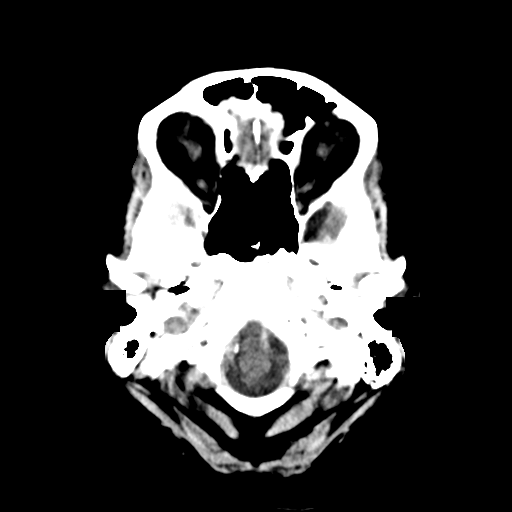
[im 2/30  bone]
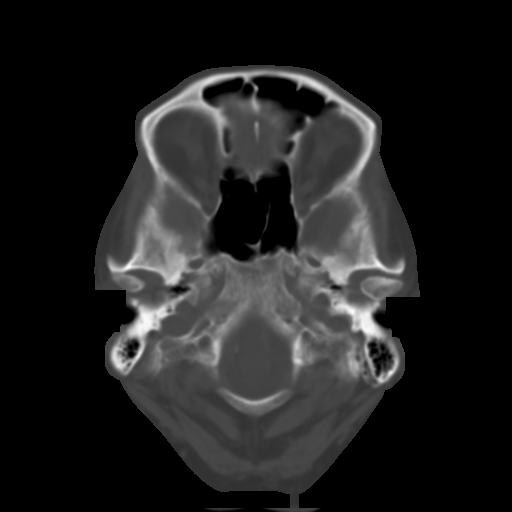
[im 4/30  brain]
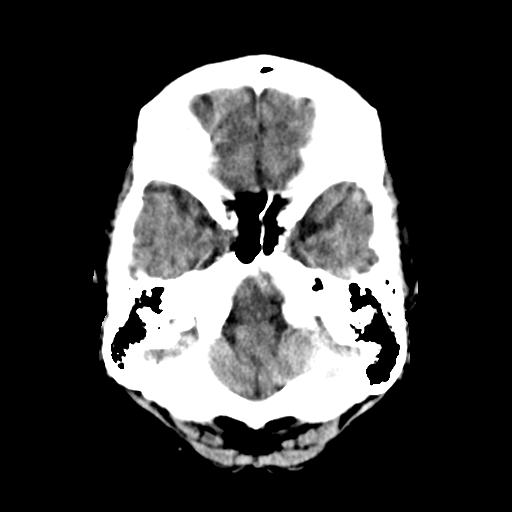
[im 6/30  brain]
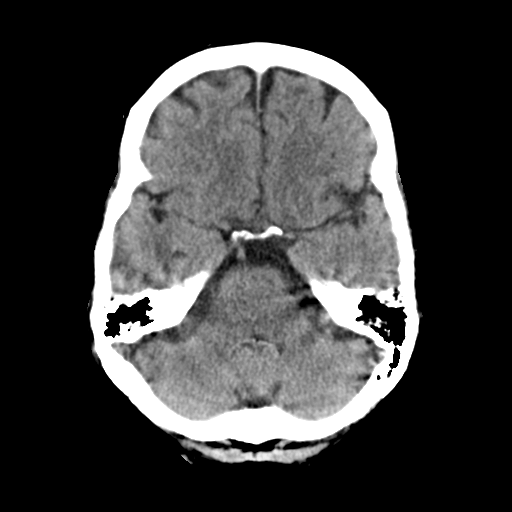
[im 8/30  brain]
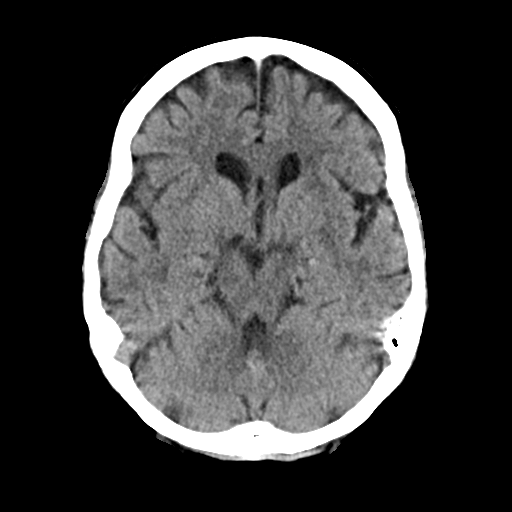
[im 9/30  brain]
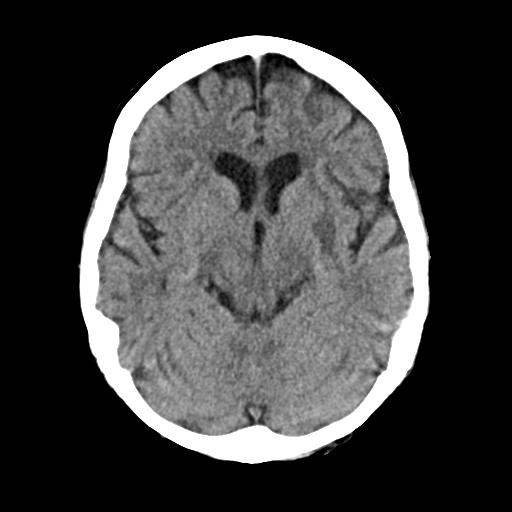
[im 9/30  bone]
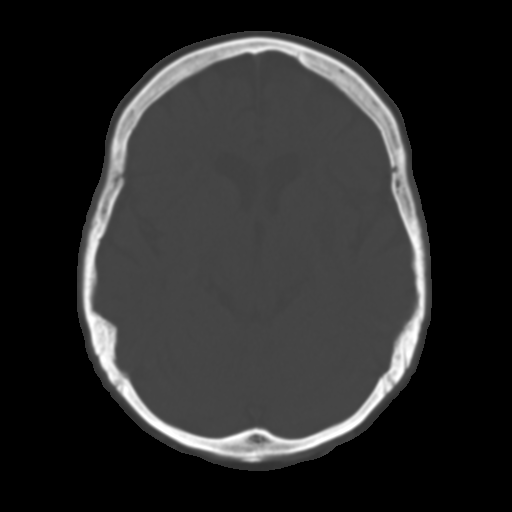
[im 11/30  brain]
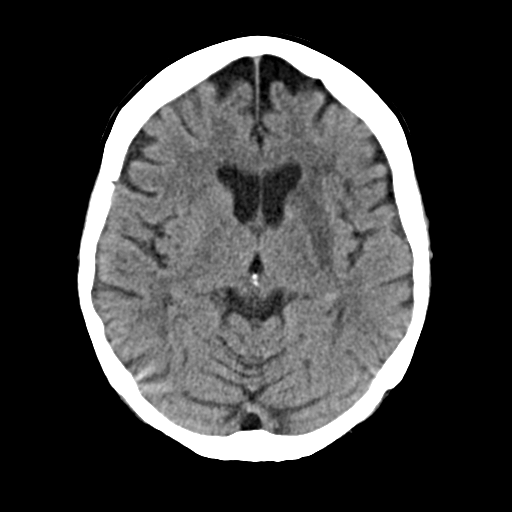
[im 13/30  brain]
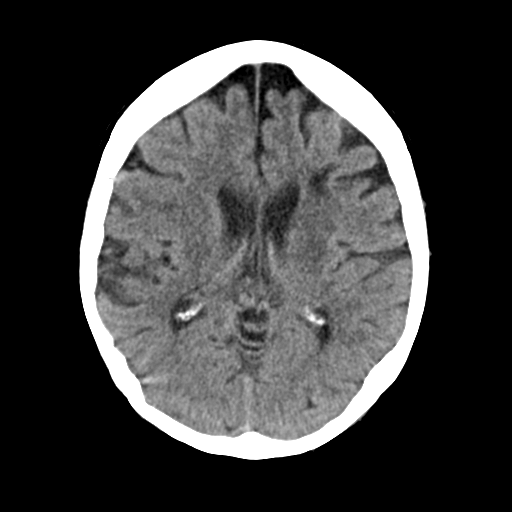
[im 15/30  brain]
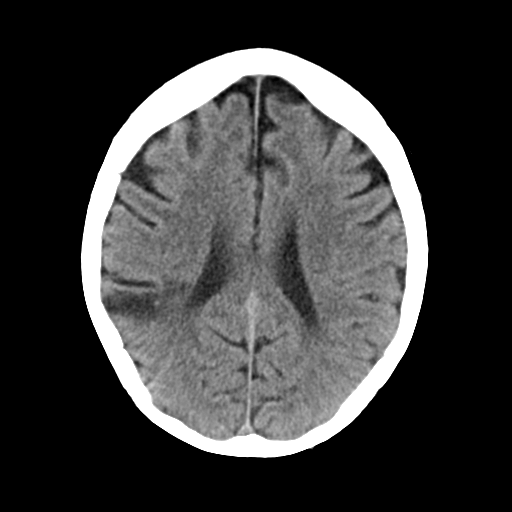
[im 16/30  brain]
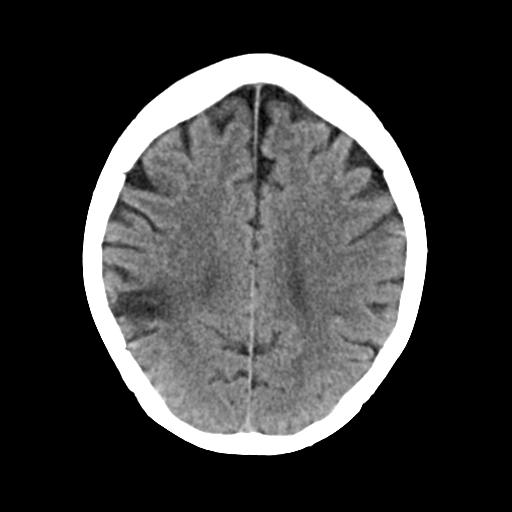
[im 16/30  bone]
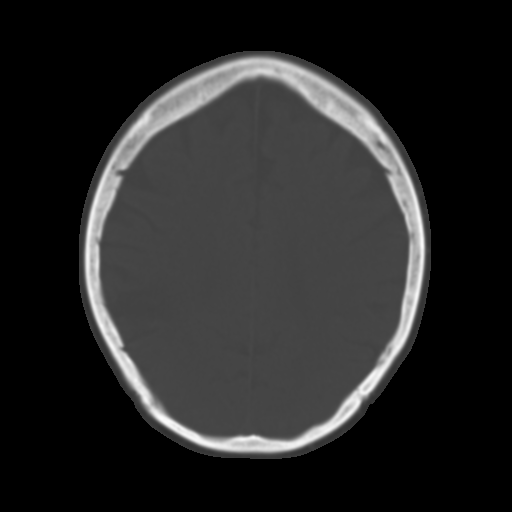
[im 18/30  brain]
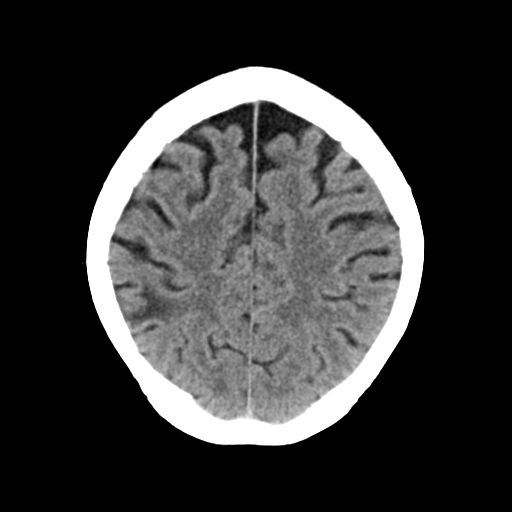
[im 20/30  brain]
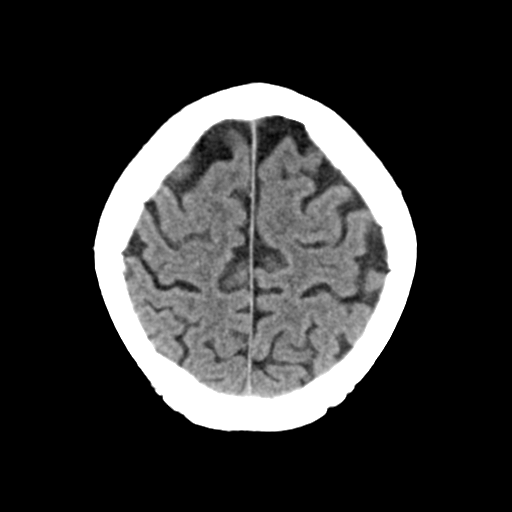
[im 22/30  brain]
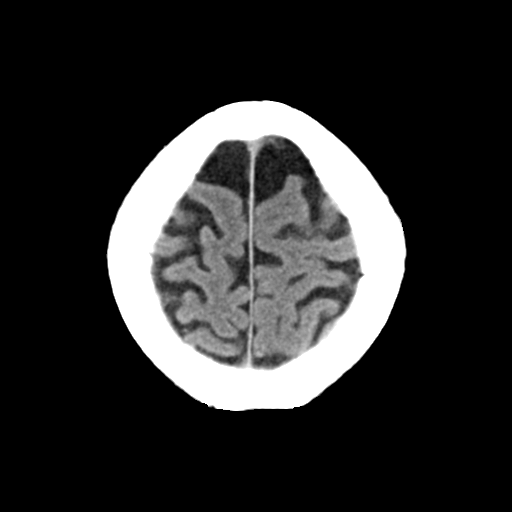
[im 23/30  brain]
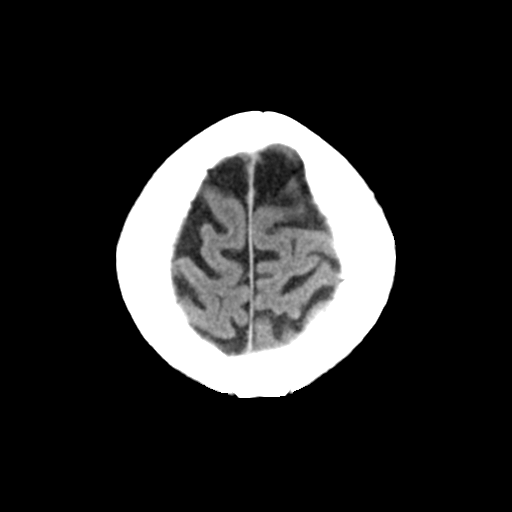
[im 23/30  bone]
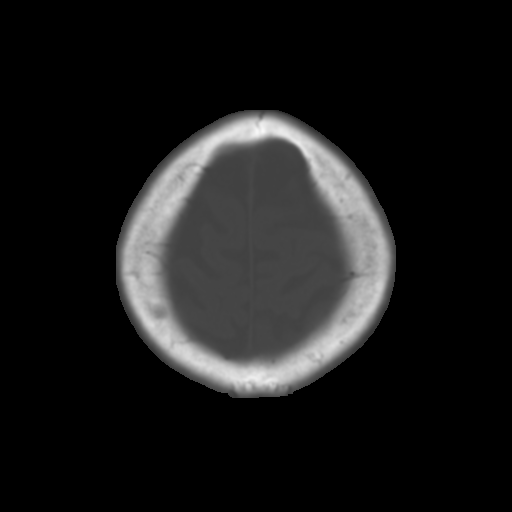
[im 25/30  brain]
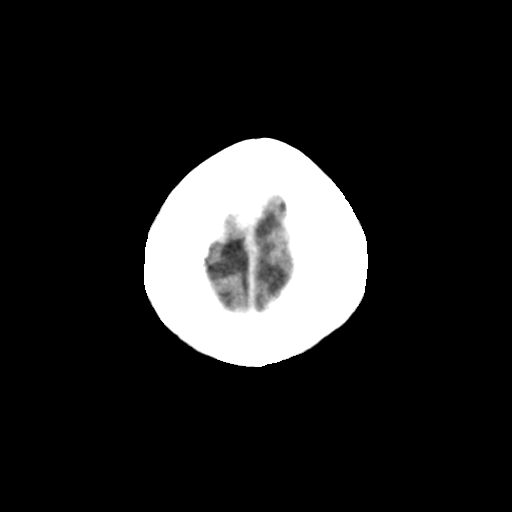
[im 27/30  brain]
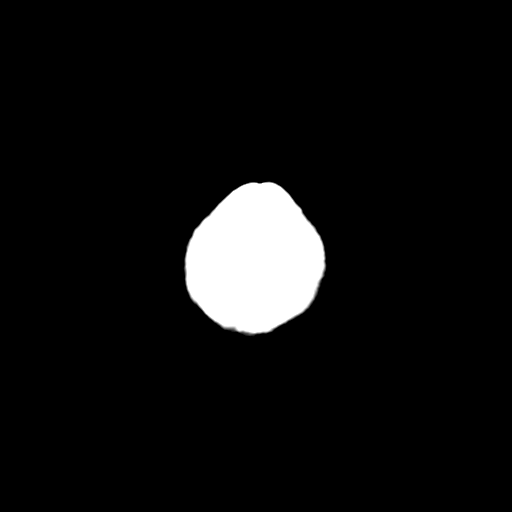
[im 29/30  brain]
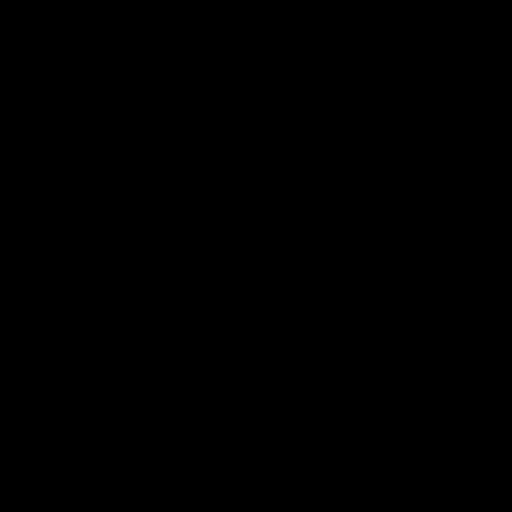

[16 of 30 positions shown; findings below may reference images not displayed]

FINDINGS: Brain: Subacute left MCA territory infarct, mainly affecting the
striatum and anterior frontal white matter shows resolved mass
effect. Petechial hemorrhage seen previously is also no longer seen.
No acute or interval infarct is detected. Remote infarcts in the
anterior left frontal and posterior right frontal regions is re-
demonstrated. No shift or hydrocephalus.

Vascular: Arterial calcification.  No hyperdense vessel.

Skull: Negative

Sinuses/Orbits: Negative
IMPRESSION: 1. Expected evolution of subacute left MCA distribution infarcts.
Mass effect and petechial hemorrhagic density have resolved.
2. Remote bilateral cerebral infarcts. No acute or interval infarct.
